# Patient Record
Sex: Female | Born: 1980 | Race: White | Hispanic: No | Marital: Married | Smoking: Current every day smoker
Health system: Southern US, Community
[De-identification: ages and names within clinical notes are randomized; demographics above are authoritative.]

## PROBLEM LIST (undated history)

## (undated) DIAGNOSIS — R319 Hematuria, unspecified: Secondary | ICD-10-CM

## (undated) DIAGNOSIS — Z720 Tobacco use: Secondary | ICD-10-CM

## (undated) DIAGNOSIS — N946 Dysmenorrhea, unspecified: Secondary | ICD-10-CM

## (undated) DIAGNOSIS — F419 Anxiety disorder, unspecified: Secondary | ICD-10-CM

## (undated) DIAGNOSIS — N920 Excessive and frequent menstruation with regular cycle: Secondary | ICD-10-CM

## (undated) DIAGNOSIS — N809 Endometriosis, unspecified: Secondary | ICD-10-CM

## (undated) DIAGNOSIS — R5383 Other fatigue: Secondary | ICD-10-CM

## (undated) DIAGNOSIS — N760 Acute vaginitis: Secondary | ICD-10-CM

## (undated) DIAGNOSIS — R87619 Unspecified abnormal cytological findings in specimens from cervix uteri: Secondary | ICD-10-CM

## (undated) DIAGNOSIS — D649 Anemia, unspecified: Secondary | ICD-10-CM

## (undated) HISTORY — DX: Anemia, unspecified: D64.9

## (undated) HISTORY — DX: Anxiety disorder, unspecified: F41.9

## (undated) HISTORY — DX: Hematuria, unspecified: R31.9

## (undated) HISTORY — DX: Other fatigue: R53.83

## (undated) HISTORY — DX: Dysmenorrhea, unspecified: N94.6

## (undated) HISTORY — DX: Acute vaginitis: N76.0

## (undated) HISTORY — DX: Excessive and frequent menstruation with regular cycle: N92.0

## (undated) HISTORY — DX: Endometriosis, unspecified: N80.9

## (undated) HISTORY — DX: Unspecified abnormal cytological findings in specimens from cervix uteri: R87.619

## (undated) HISTORY — DX: Tobacco use: Z72.0

## (undated) HISTORY — PX: CERVICAL BIOPSY  W/ LOOP ELECTRODE EXCISION: SUR135

---

## 2004-09-25 ENCOUNTER — Emergency Department: Payer: Self-pay | Admitting: Emergency Medicine

## 2005-11-25 ENCOUNTER — Emergency Department: Payer: Self-pay | Admitting: Unknown Physician Specialty

## 2007-06-03 ENCOUNTER — Inpatient Hospital Stay: Payer: Self-pay | Admitting: Unknown Physician Specialty

## 2007-06-03 ENCOUNTER — Observation Stay: Payer: Self-pay

## 2008-06-20 ENCOUNTER — Emergency Department: Payer: Self-pay | Admitting: Emergency Medicine

## 2009-04-30 ENCOUNTER — Emergency Department: Payer: Self-pay | Admitting: Emergency Medicine

## 2009-05-13 HISTORY — PX: TUBAL LIGATION: SHX77

## 2009-06-22 ENCOUNTER — Emergency Department: Payer: Self-pay | Admitting: Unknown Physician Specialty

## 2011-09-26 ENCOUNTER — Emergency Department: Payer: Self-pay | Admitting: *Deleted

## 2011-11-19 ENCOUNTER — Emergency Department: Payer: Self-pay | Admitting: Emergency Medicine

## 2013-05-13 HISTORY — PX: LAPAROSCOPIC ABDOMINAL EXPLORATION: SHX6249

## 2013-05-13 HISTORY — PX: LAPAROSCOPY: SHX197

## 2013-05-13 HISTORY — PX: VAGINAL HYSTERECTOMY: SUR661

## 2013-06-07 ENCOUNTER — Emergency Department: Payer: Self-pay | Admitting: Emergency Medicine

## 2013-06-07 LAB — CBC WITH DIFFERENTIAL/PLATELET
Basophil #: 0.1 10*3/uL (ref 0.0–0.1)
Basophil %: 0.8 %
Eosinophil #: 0.1 10*3/uL (ref 0.0–0.7)
Eosinophil %: 1.2 %
HCT: 44.5 % (ref 35.0–47.0)
HGB: 14.5 g/dL (ref 12.0–16.0)
Lymphocyte #: 3.1 10*3/uL (ref 1.0–3.6)
Lymphocyte %: 26.8 %
MCH: 28.9 pg (ref 26.0–34.0)
MCHC: 32.7 g/dL (ref 32.0–36.0)
MCV: 88 fL (ref 80–100)
MONOS PCT: 3.7 %
Monocyte #: 0.4 x10 3/mm (ref 0.2–0.9)
NEUTROS ABS: 7.8 10*3/uL — AB (ref 1.4–6.5)
NEUTROS PCT: 67.5 %
PLATELETS: 237 10*3/uL (ref 150–440)
RBC: 5.03 10*6/uL (ref 3.80–5.20)
RDW: 13.5 % (ref 11.5–14.5)
WBC: 11.6 10*3/uL — ABNORMAL HIGH (ref 3.6–11.0)

## 2013-06-07 LAB — COMPREHENSIVE METABOLIC PANEL
ALBUMIN: 4.4 g/dL (ref 3.4–5.0)
ALK PHOS: 94 U/L
AST: 20 U/L (ref 15–37)
Anion Gap: 3 — ABNORMAL LOW (ref 7–16)
BUN: 9 mg/dL (ref 7–18)
Bilirubin,Total: 0.3 mg/dL (ref 0.2–1.0)
CALCIUM: 9.4 mg/dL (ref 8.5–10.1)
CO2: 30 mmol/L (ref 21–32)
Chloride: 102 mmol/L (ref 98–107)
Creatinine: 0.63 mg/dL (ref 0.60–1.30)
EGFR (African American): >60
EGFR (Non-African Amer.): >60
Glucose: 88 mg/dL (ref 65–99)
Osmolality: 268 (ref 275–301)
POTASSIUM: 3.3 mmol/L — AB (ref 3.5–5.1)
SGPT (ALT): 18 U/L (ref 12–78)
SODIUM: 135 mmol/L — AB (ref 136–145)
Total Protein: 8.5 g/dL — ABNORMAL HIGH (ref 6.4–8.2)

## 2013-06-07 LAB — URINALYSIS, COMPLETE
BILIRUBIN, UR: NEGATIVE
GLUCOSE, UR: NEGATIVE mg/dL (ref 0–75)
Hyaline Cast: 1
Ketone: NEGATIVE
Leukocyte Esterase: NEGATIVE
NITRITE: NEGATIVE
PROTEIN: NEGATIVE
Ph: 6 (ref 4.5–8.0)
RBC,UR: 5 /HPF (ref 0–5)
SPECIFIC GRAVITY: 1.019 (ref 1.003–1.030)
Squamous Epithelial: 11
WBC UR: 2 /HPF (ref 0–5)

## 2013-06-07 LAB — LIPASE, BLOOD: Lipase: 75 U/L (ref 73–393)

## 2013-06-08 LAB — GC/CHLAMYDIA PROBE AMP

## 2013-06-08 LAB — WET PREP, GENITAL

## 2013-10-20 ENCOUNTER — Ambulatory Visit: Payer: Self-pay | Admitting: Obstetrics and Gynecology

## 2013-10-20 LAB — CBC
HCT: 41.4 % (ref 35.0–47.0)
HGB: 13.6 g/dL (ref 12.0–16.0)
MCH: 29.5 pg (ref 26.0–34.0)
MCHC: 32.7 g/dL (ref 32.0–36.0)
MCV: 90 fL (ref 80–100)
PLATELETS: 207 10*3/uL (ref 150–440)
RBC: 4.59 10*6/uL (ref 3.80–5.20)
RDW: 13.6 % (ref 11.5–14.5)
WBC: 10.7 10*3/uL (ref 3.6–11.0)

## 2013-10-25 ENCOUNTER — Ambulatory Visit: Payer: Self-pay | Admitting: Obstetrics and Gynecology

## 2013-10-26 LAB — PATHOLOGY REPORT

## 2014-03-03 ENCOUNTER — Ambulatory Visit: Payer: Self-pay | Admitting: Obstetrics and Gynecology

## 2014-03-03 LAB — CBC
HCT: 41.2 % (ref 35.0–47.0)
HGB: 13.8 g/dL (ref 12.0–16.0)
MCH: 29.8 pg (ref 26.0–34.0)
MCHC: 33.4 g/dL (ref 32.0–36.0)
MCV: 89 fL (ref 80–100)
Platelet: 190 10*3/uL (ref 150–440)
RBC: 4.62 10*6/uL (ref 3.80–5.20)
RDW: 13.5 % (ref 11.5–14.5)
WBC: 9.3 10*3/uL (ref 3.6–11.0)

## 2014-03-03 LAB — BASIC METABOLIC PANEL
ANION GAP: 3 — AB (ref 7–16)
BUN: 12 mg/dL (ref 7–18)
CHLORIDE: 111 mmol/L — AB (ref 98–107)
CO2: 27 mmol/L (ref 21–32)
Calcium, Total: 8.5 mg/dL (ref 8.5–10.1)
Creatinine: 0.74 mg/dL (ref 0.60–1.30)
EGFR (African American): >60
EGFR (Non-African Amer.): >60
Glucose: 93 mg/dL (ref 65–99)
Osmolality: 281 (ref 275–301)
Potassium: 3.8 mmol/L (ref 3.5–5.1)
Sodium: 141 mmol/L (ref 136–145)

## 2014-03-07 ENCOUNTER — Ambulatory Visit: Payer: Self-pay | Admitting: Obstetrics and Gynecology

## 2014-03-08 LAB — HEMOGLOBIN
HGB: 7 g/dL — ABNORMAL LOW (ref 12.0–16.0)
HGB: 7.2 g/dL — AB (ref 12.0–16.0)

## 2014-03-21 ENCOUNTER — Ambulatory Visit: Payer: Self-pay | Admitting: Obstetrics and Gynecology

## 2014-03-21 LAB — COMPREHENSIVE METABOLIC PANEL
ALBUMIN: 2.9 g/dL — AB (ref 3.4–5.0)
ALT: 25 U/L
AST: 11 U/L — AB (ref 15–37)
Alkaline Phosphatase: 83 U/L
Anion Gap: 6 — ABNORMAL LOW (ref 7–16)
BILIRUBIN TOTAL: 0.5 mg/dL (ref 0.2–1.0)
BUN: 8 mg/dL (ref 7–18)
CREATININE: 0.7 mg/dL (ref 0.60–1.30)
Calcium, Total: 8.5 mg/dL (ref 8.5–10.1)
Chloride: 105 mmol/L (ref 98–107)
Co2: 28 mmol/L (ref 21–32)
EGFR (Non-African Amer.): >60
Glucose: 145 mg/dL — ABNORMAL HIGH (ref 65–99)
OSMOLALITY: 278 (ref 275–301)
POTASSIUM: 3.3 mmol/L — AB (ref 3.5–5.1)
Sodium: 139 mmol/L (ref 136–145)
Total Protein: 7.6 g/dL (ref 6.4–8.2)

## 2014-03-21 LAB — LIPASE, BLOOD: Lipase: 51 U/L — ABNORMAL LOW (ref 73–393)

## 2014-03-21 LAB — CBC
HCT: 26.2 % — ABNORMAL LOW (ref 35.0–47.0)
HGB: 8.5 g/dL — AB (ref 12.0–16.0)
MCH: 28.6 pg (ref 26.0–34.0)
MCHC: 32.5 g/dL (ref 32.0–36.0)
MCV: 88 fL (ref 80–100)
PLATELETS: 359 10*3/uL (ref 150–440)
RBC: 2.98 10*6/uL — ABNORMAL LOW (ref 3.80–5.20)
RDW: 14 % (ref 11.5–14.5)
WBC: 13.2 10*3/uL — AB (ref 3.6–11.0)

## 2014-03-21 LAB — PROTIME-INR
INR: 1.2
PROTHROMBIN TIME: 14.7 s (ref 11.5–14.7)

## 2014-03-21 LAB — APTT: ACTIVATED PTT: 37.9 s — AB (ref 23.6–35.9)

## 2014-03-21 LAB — HEMATOCRIT: HCT: 25.6 % — ABNORMAL LOW (ref 35.0–47.0)

## 2014-03-22 LAB — CBC WITH DIFFERENTIAL/PLATELET
BASOS ABS: 0.1 10*3/uL (ref 0.0–0.1)
Basophil %: 0.4 %
Eosinophil #: 0 10*3/uL (ref 0.0–0.7)
Eosinophil %: 0 %
HCT: 24 % — ABNORMAL LOW (ref 35.0–47.0)
HGB: 7.7 g/dL — ABNORMAL LOW (ref 12.0–16.0)
Lymphocyte #: 0.7 10*3/uL — ABNORMAL LOW (ref 1.0–3.6)
Lymphocyte %: 5.5 %
MCH: 28.4 pg (ref 26.0–34.0)
MCHC: 32.1 g/dL (ref 32.0–36.0)
MCV: 88 fL (ref 80–100)
MONO ABS: 0.6 x10 3/mm (ref 0.2–0.9)
MONOS PCT: 4.8 %
Neutrophil #: 10.7 10*3/uL — ABNORMAL HIGH (ref 1.4–6.5)
Neutrophil %: 89.3 %
Platelet: 274 10*3/uL (ref 150–440)
RBC: 2.71 10*6/uL — ABNORMAL LOW (ref 3.80–5.20)
RDW: 14.2 % (ref 11.5–14.5)
WBC: 12 10*3/uL — ABNORMAL HIGH (ref 3.6–11.0)

## 2014-06-13 IMAGING — US US PELV - US TRANSVAGINAL
1 series · 14 of 25 positions shown · non-contrast
Comparison: None

CLINICAL DATA: Diffuse lower abdominal pain

EXAM:
TRANSABDOMINAL AND TRANSVAGINAL ULTRASOUND OF PELVIS
TECHNIQUE: Both transabdominal and transvaginal ultrasound examinations of the
pelvis were performed. Transabdominal technique was performed for
global imaging of the pelvis including uterus, ovaries, adnexal
regions, and pelvic cul-de-sac. It was necessary to proceed with
endovaginal exam following the transabdominal exam to visualize the
endometrium and adnexa.

[Series 1: us pelv - us transvaginal · 0.22mm/px · 14 of 38 slices shown]
[im 1/38]
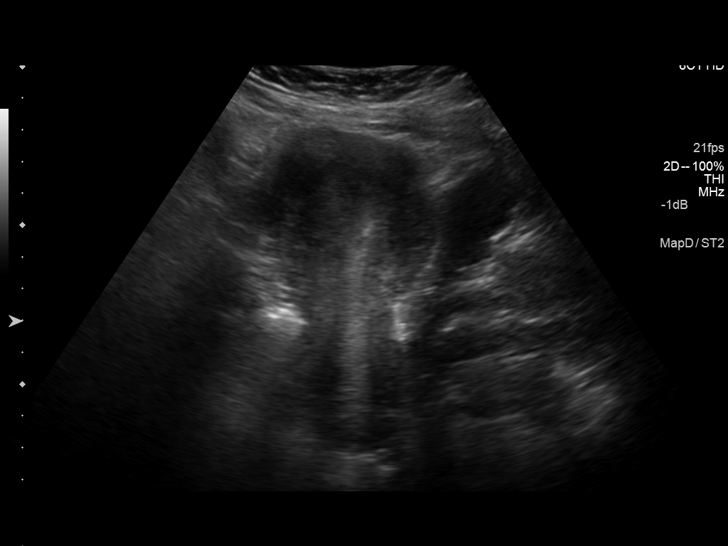
[im 4/38]
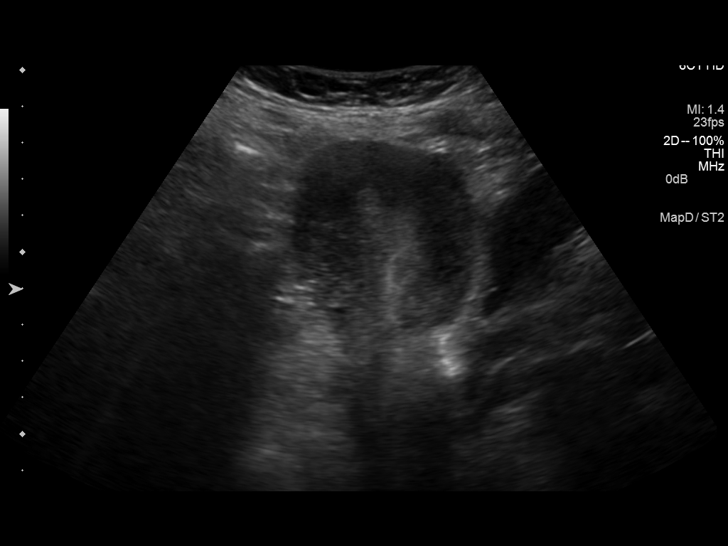
[im 7/38]
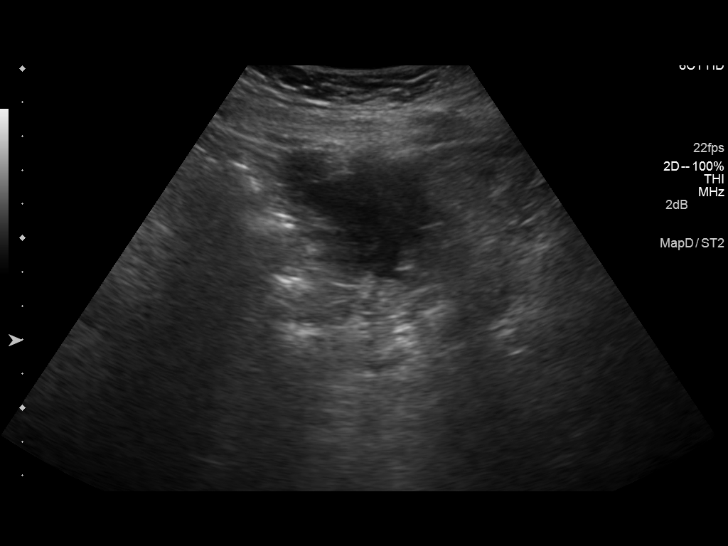
[im 10/38]
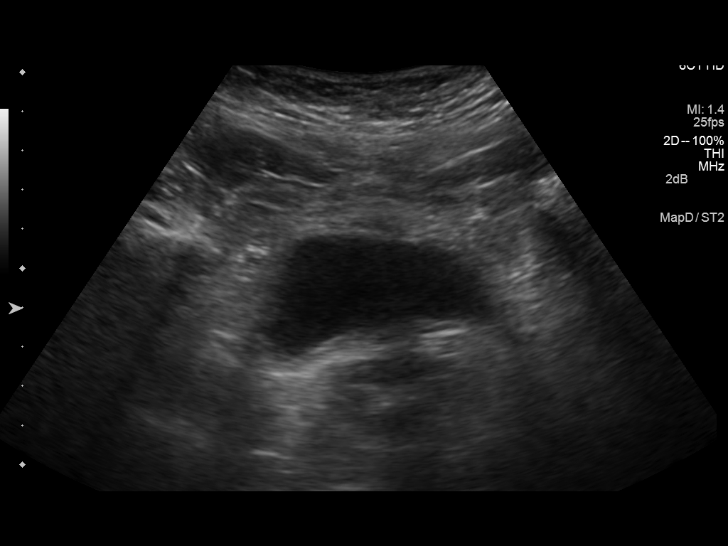
[im 13/38]
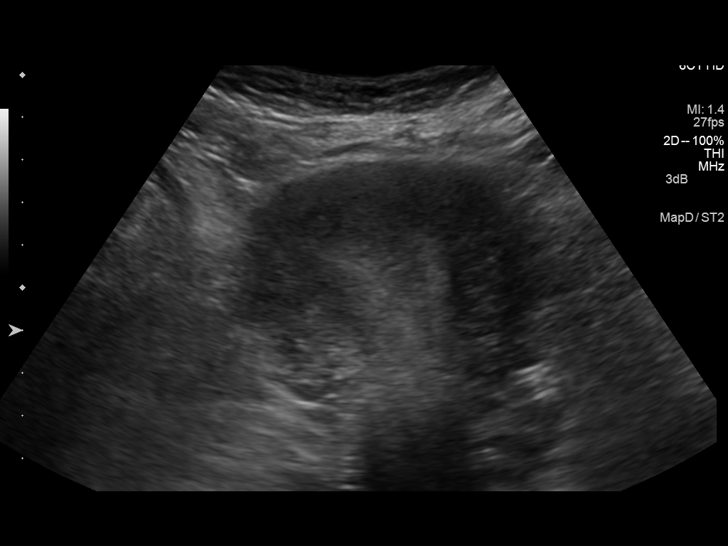
[im 14/38]
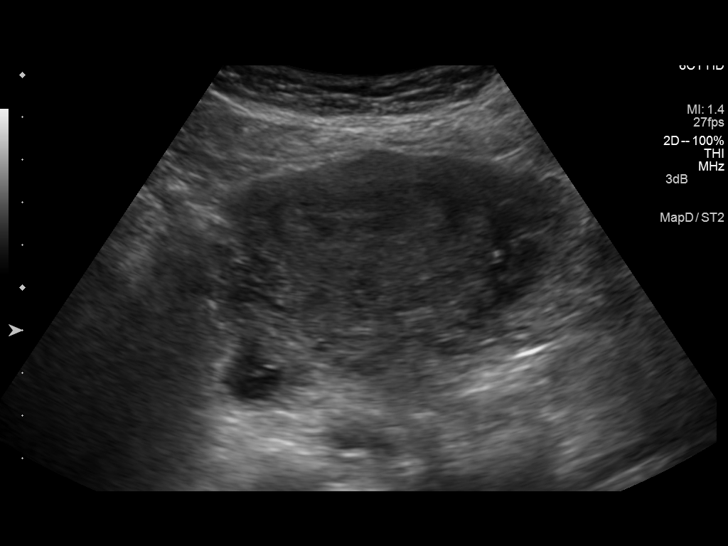
[im 17/38]
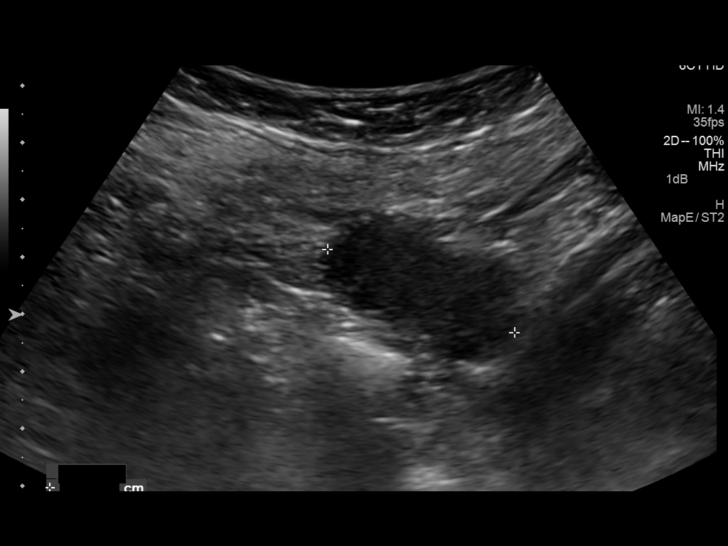
[im 21/38]
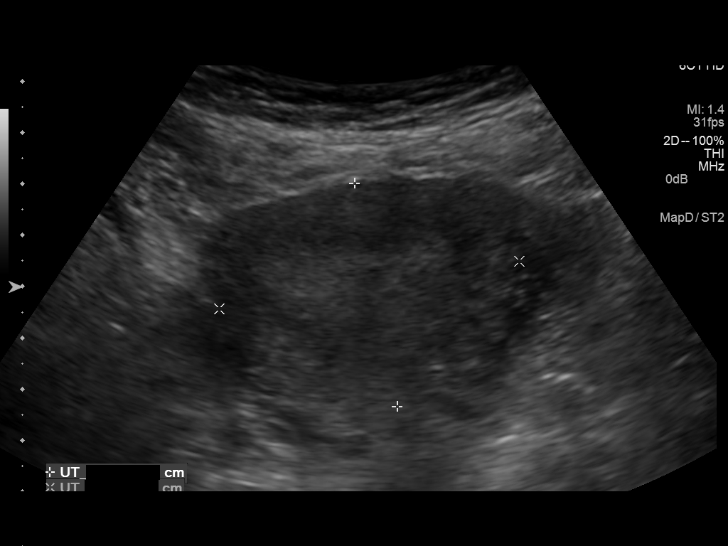
[im 24/38]
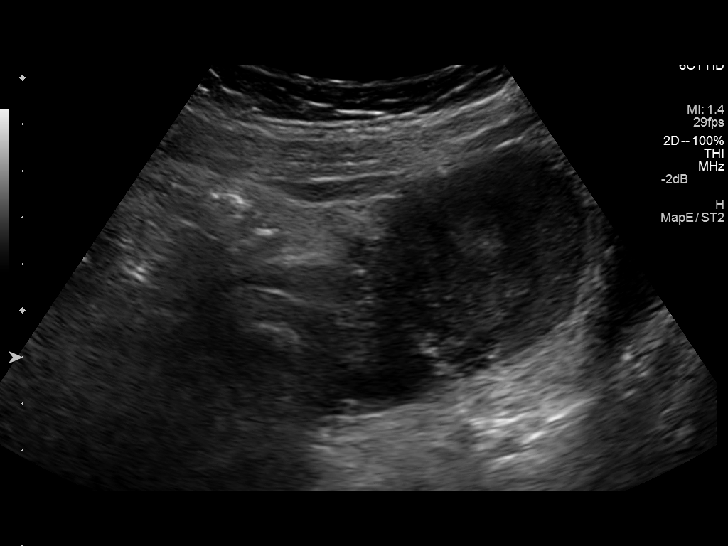
[im 25/38]
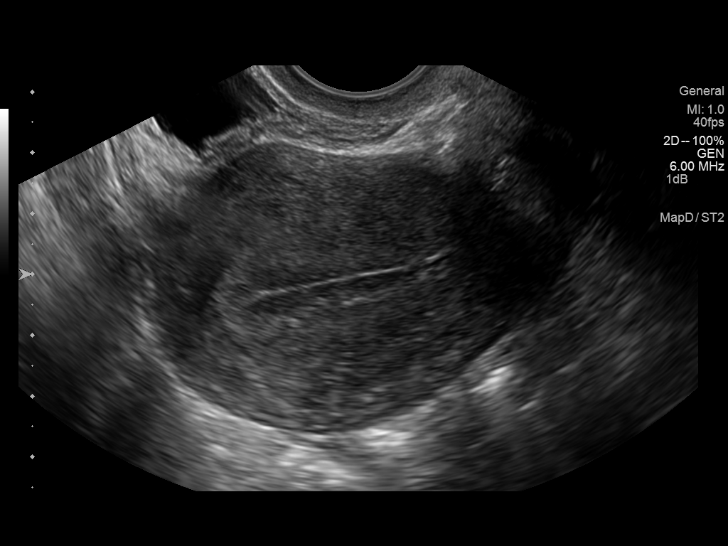
[im 28/38]
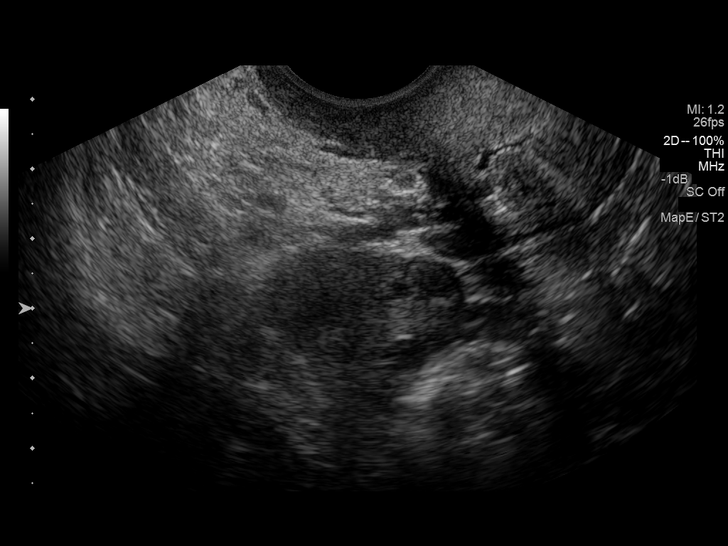
[im 31/38]
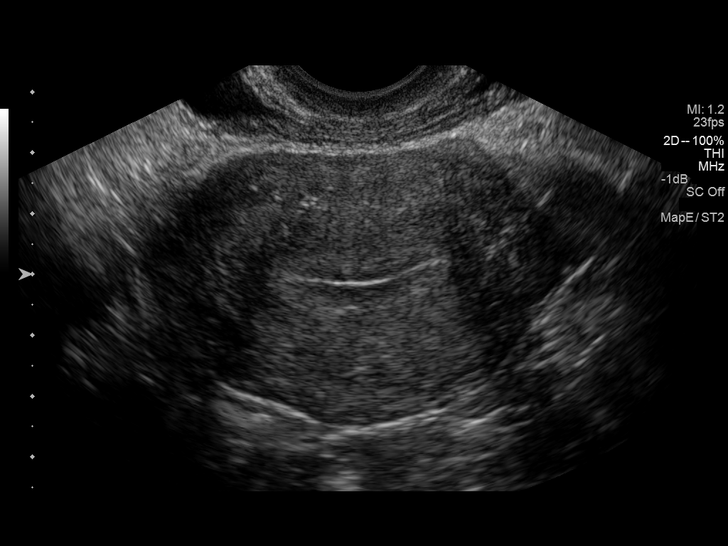
[im 34/38]
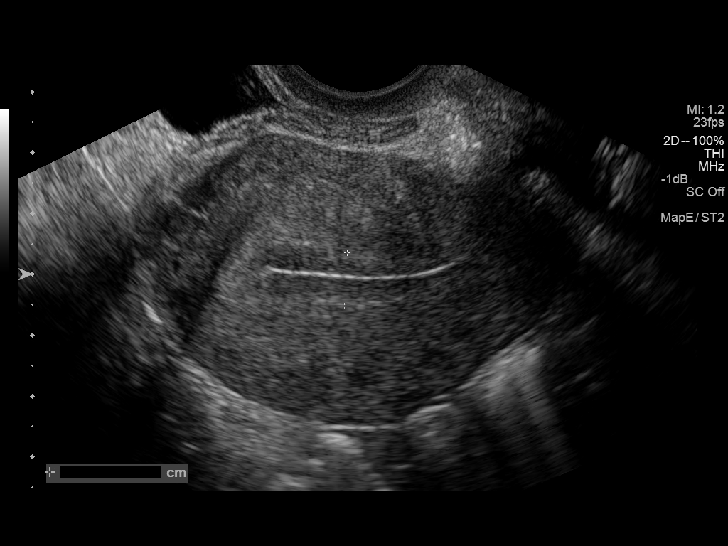
[im 38/38]
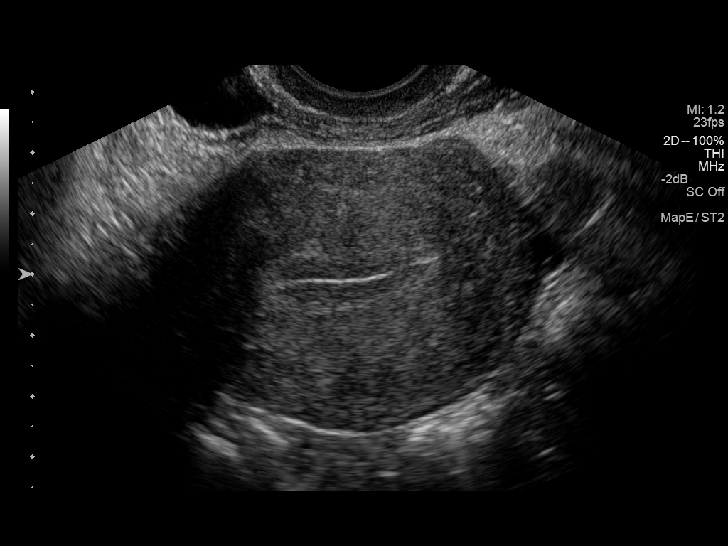

[14 of 25 positions shown; findings below may reference images not displayed]

FINDINGS: Uterus

Measurements: 7.5 x 4.4 x 5.9 cm. No fibroids or other mass
visualized.

Endometrium

Thickness: 9 mm.  No focal abnormality visualized.

Right ovary

Measurements: 2.8 x 2.2 x 2.1 cm. Normal appearance/no adnexal mass.

Left ovary

Measurements: 3.6 x 1.9 x 2.3 cm. Normal appearance/no adnexal mass.

Other findings

No free fluid.
IMPRESSION: Pelvic ultrasound within normal limits.

## 2014-09-03 NOTE — Op Note (Signed)
PATIENT NAME:  Laurie Huynh, [name] MR#:  [id number] DATE OF BIRTH:  Mar 06, 1981  DATE OF PROCEDURE:  10/25/2013  PREOPERATIVE DIAGNOSES:  1. Severe dysmenorrhea.  2. Chronic pelvic pain.  3. Menorrhagia.   POSTOPERATIVE DIAGNOSES:  1. Severe dysmenorrhea.  2. Chronic pelvic pain.  3. Menorrhagia.  4. Pelvic adhesions.   OPERATIVE PROCEDURE: Laparoscopy with peritoneal biopsies.   SURGEON: Prentice DockerMartin A. Latrisha Coiro, MD    FIRST ASSISTANT: Mancel BaleJustin Allis, PA-S   ANESTHESIA: General endotracheal.   INDICATIONS: The patient is a 34 year old white female, para 3, 0-0-3, status post BTL for contraception, with history of previous cesarean section x 1, presents for evaluation of chronic pelvic pain in the form of severe dysmenorrhea and deep-thrusting dyspareunia. She also had associated menorrhagia. The patient reports discomfort complicating her activities of daily living.   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 The uterus is potentially operable through both LAVH or TVH technique if indicated.   Findings at laparoscopy included evidence of previous BTL. The ovaries were grossly normal. There was a small white lesion noted on the uterine serosa in the left fundus region. There was a cul-de-sac pseudofenestration on the right side and there were grossly normal-appearing ovaries, appendix, gallbladder, liver. There were adhesions in the lower uterine segment in the region of the bladder flap on the left which were also biopsied.   DESCRIPTION OF PROCEDURE: Patient was brought to the operating room where she was placed in the supine position. General endotracheal anesthesia was induced without difficulty. She was placed in dorsal lithotomy position using the bumblebee stirrups. A ChloraPrep and Betadine abdominal, perineal, intravaginal prep and drape was performed in standard fashion. A Red Robinson catheter was used to drain 100 mL of urine from the bladder.  A Hulka  Tenaculum was placed onto the cervix to facilitate uterine manipulation. Subumbilical vertical incision 5 mm in length was made. The Optiview laparoscopic trocar system was used to place a 5 mm port directly into the abdominopelvic cavity without evidence of bowel or vascular injury. Next, a 2nd 5 mm port was placed in the lower abdomen just above the symphysis pubis to the right of midline. No injury was identified. The above-noted findings were photo documented. Representative biopsies were taken of the central cul-de-sac, right side of the cul-de-sac, anterior fundal uterine serosa on the left, and bladder flap adhesions. Cautery was used with Kleppinger bipolar forceps to control bleeding from the uterine biopsy sites. After verifying there was adequate hemostasis, the procedure was terminated with all instrumentation being removed from the abdominal cavity. The incisions were closed with Dermabond. The patient was then awakened, extubated, mobilized, and taken to the recovery room in satisfactory condition.   ESTIMATED BLOOD LOSS: Minimal.   COMPLICATIONS: None.   All instruments, needle, and sponge counts were verified as correct. IV fluids infused were 1 liter. Urine output was 100 mL.   ____________________________ Prentice DockerMartin A. Dejae Bernet, MD mad:dd D: 10/25/2013 14:11:06 ET T: 10/25/2013 19:26:41 ET JOB#: [id number]  cc: Daphine DeutscherMartin A. Erdem Naas, MD, <Dictator> [facility] Prentice DockerMARTIN A Danikah Budzik MD ELECTRONICALLY SIGNED 11/10/2013 17:23

## 2014-09-03 NOTE — Op Note (Signed)
PATIENT NAME:  Laurie, Huynh MR#:  [id number] DATE OF BIRTH:  [birth year]  DATE OF PROCEDURE:  03/21/2014  PREOPERATIVE DIAGNOSES:  1.  Pelvic pain/ back pain.  2.  Suspected pelvic hematoma.  3.  Status post laparoscopic-assisted vaginal hysterectomy, bilateral salpingectomy 2 weeks ago.  POSTOPERATIVE DIAGNOSES: 1.  Pelvic pain/ back pain.  2.  Suspected pelvic hematoma.  3.  Status post laparoscopic-assisted vaginal hysterectomy, bilateral salpingectomy 2 weeks ago.  OPERATIVE PROCEDURE: Laparoscopic evacuation of pelvic hematoma.   SURGEON: Prentice Docker. Janey Petron, MD   FIRST ASSISTANT: None.   ANESTHESIA: General endotracheal.   INDICATIONS: The patient is a 34 year old white female, status post LAVH/bilateral salpingectomy 2 weeks ago, who presented through the Emergency Room with new onset of lower abdominal pelvic pain and vaginal bleeding. Workup in the ER demonstrated a drop in hemoglobin to 8.6 grams and pelvic ultrasound demonstrated a possible hematoma within the pelvis. The patient was admitted for surgical evaluation and management.   FINDINGS AT SURGERY: Revealed an organized hematoma within the pelvis with approximately 250 mL of old blood removed. Adhesiolysis and extensive irrigation of the pelvis was performed with evidence of a mild diffuse peritoneal ooze without obvious source of active arterial bleeding. Arista was sprinkled over the diffuse peritoneum in the vaginal cuff region.   DESCRIPTION OF PROCEDURE: The patient was brought to the operating room where she was placed in the supine position. General endotracheal anesthesia was induced without difficulty. She was placed in the dorsal lithotomy position using bumblebee stirrups. A ChloraPrep and Betadine abdominal, perineal, intravaginal prep and drape was performed in standard fashion. A red Robinson catheter was used to drain 100 mL of urine from the bladder. A subumbilical 5 mm incision was made and the  Optiview laparoscopic trocar system was placed intra-abdominally under direct visualization. No bowel or vascular injury was encountered. Pneumoperitoneum was created. A second port was placed lateral to the initial surgical port in the right lower quadrant. It was placed far laterally because of the adhesions between the omentum and the pelvic cuff and anterior abdominal wall. A second port was also placed in the left lower quadrant in the region of the previously placed port from the last surgery. The anterior abdominal wall omental adhesions were taken down bluntly. Additional adhesions were taken up from the vaginal cuff as well as in the cul-de-sac. Eventually, both ovaries were identified bilaterally and the infundibulopelvic ligaments were intact. No gross abnormalities of the ovaries were noted. There was no bleeding from the bilateral salpingectomy sites. The vaginal cuff and anterior peritoneal surface overlying the bladder was somewhat cloudy in appearance and had a minimal diffuse ooze present. Copious irrigation was performed of the pelvis with the irrigant fluid being aspirated. There was no need for any active cautery for hemostasis as no arterial bleeding was identified. Once satisfied with overall inspection of the pelvis and upper abdomen, Arista was applied, 3 grams, in standard fashion over the pelvic cuff. The procedure was then terminated with all instrumentation being removed from the abdominopelvic cavity. Pneumoperitoneum was released. The incisions were closed with 4-0 Vicryl sutures. Dermabond glue was placed over the incisions. The patient was then awakened, mobilized, and taken to the recovery room in satisfactory condition.   ESTIMATED BLOOD LOSS: Minimal. There was approximately 250 mL of old blood evacuated from the organized hematoma.  INTRAVENOUS FLUIDS: Crystalloid 1200 mL.   URINE OUTPUT: 100 mL.  ANTIBIOTICS RECEIVED: The patient did receive gentamicin and clindamycin  antibiotic prophylaxis  because of a temperature of 101 noted in the operating room. A second dose will be given postoperatively.    ____________________________ Prentice DockerMartin A. Lucion Dilger, MD mad:TT D: 03/21/2014 16:37:59 ET T: 03/21/2014 17:48:13 ET JOB#: [id number]  cc: Daphine DeutscherMartin A. Malillany Kazlauskas, MD, <Dictator> [facility] Prentice DockerMARTIN A Ricardo Schubach MD ELECTRONICALLY SIGNED 03/22/2014 13:49

## 2014-09-03 NOTE — H&P (Signed)
PATIENT NAME:  Laurie Huynh, Shenetta L MR#:  [id number] DATE OF BIRTH:  11-09-1980  DATE OF ADMISSION:  03/21/2014  DIAGNOSES: 1. Abdominal pain.  2. Status post laparoscopic-assisted vaginal hysterectomy, bilateral salpingo-oophorectomy 2 weeks ago.  3. Suspected intra-abdominal bleed.   HISTORY: The patient is a 34 year old, married, white female, multiparous, status post LAVH, BSO 2 weeks ago, who presents for admission and surgical evaluation of suspected postoperative bleeding with probable hematoma identified on pelvic ultrasound. The patient had a postoperative hemoglobin of 7.1. One week later, her hemoglobin was 9.1. Within the past 24 hours, she developed lower abdominal pain and back pain along with onset of bright red vaginal bleeding. Because of these symptoms, she was brought to the Emergency Room.   Findings in the ER were notable for a vaginal bleeding source, hemoglobin of 8.5 (decreased), and a pelvic ultrasound that demonstrated a 12 cm x 7 cm heterogenous mass, likely consistent with clot in the pelvis. Because of these findings and the patient's symptomatology, she is now admitted for laparoscopy for further evaluation, evacuation of clot, and identification of possible source of intra-abdominal bleed.   PAST MEDICAL HISTORY: Endometriosis.  PAST SURGICAL HISTORY: C-section, LAVH, BSO, laparoscopy with peritoneal biopsies.   PAST OBSTETRIC HISTORY: Multiparous,   FAMILY HISTORY: Noncontributory.   SOCIAL HISTORY: The patient is a tobacco user. She does not drink alcohol. Does not use drugs.   CURRENT MEDICATIONS: Zoloft 50 mg daily, ibuprofen 800 mg t.i.d., folic acid daily, ferrous sulfate b.i.d.   DRUG ALLERGIES: PENICILLIN.   REVIEW OF SYSTEMS: As per HPI.   PHYSICAL EXAMINATION:  VITAL SIGNS: Heart rate 100, blood pressure 100/60. GENERAL: The patient is a pleasant, well-appearing, alert, and oriented female in no acute distress.  ABDOMEN: Soft, slightly tender  without peritoneal signs or guarding.  LUNGS: Clear.  HEART: Mild tachycardia. No murmur.  ABDOMEN: Soft, nondistended, 1 to 2/4 tenderness in lower quadrants bilaterally.  PELVIC: External genitalia normal and BUS normal. Vagina has good vault support. The suture line from previous surgery is intact. There is minimal oozing from the vaginal cuff demonstrating some burgundy blood. Bimanual exam reveals tenderness at the vaginal cuff site with a sense of fullness without discrete mass. No adnexal masses can be appreciated. Rectovaginal exam confirms the central pelvic mass.  EXTREMITIES: Warm and dry.   IMPRESSION:  1. Two weeks status post laparoscopic-assisted vaginal hysterectomy, bilateral salpingo-oophorectomy.  2. Suspected intra-abdominal bleed with pelvic hematoma, symptomatic.   PLAN: Laparoscopy with evacuation of clot and identification of possible bleeding source, possible laparotomy.   CONSENT: The patient is to undergo laparoscopy with evacuation of hematoma and identification of bleeding source. She is understanding of the planned procedure and is aware of and is accepting of all surgical risks which include but are not limited to bleeding, infection, pelvic organ injury with need for repair, blood clot disorders, anesthesia risks, etc. She does understanding that, if the bleeding site is not identified adequately or if bleeding cannot be controlled laparoscopically, then laparotomy may be necessary. All questions are answered. Informed consent is given. The patient is ready and willing to proceed with surgery as scheduled. ____________________________ Prentice DockerMartin A. Rabecka Brendel, MD mad:jh D: 03/21/2014 12:53:00 ET T: 03/21/2014 14:06:21 ET  JOB#: [id number] Daphine DeutscherMARTIN A Mikaelah Trostle MD ELECTRONICALLY SIGNED 03/22/2014 13:49

## 2014-09-03 NOTE — Op Note (Signed)
PATIENT NAME:  Laurie Huynh, Delight L MR#:  [id number] DATE OF BIRTH:  Jun 26, 1980  DATE OF PROCEDURE:  03/07/2014  PREOPERATIVE DIAGNOSES:  1.  Symptomatic endometriosis.  2.  Pelvic adhesive disease.  POSTOPERATIVE DIAGNOSES: 1.  Symptomatic endometriosis.  2.  Pelvic adhesive disease.    OPERATIVE PROCEDURE: Laparoscopic assisted vaginal hysterectomy and bilateral salpingectomy.   SURGEON: Prentice DockerMartin A. Johnryan Sao, MD  FIRST ASSISTANT: Anika S. Valentino Saxonherry, MD  ANESTHESIA: General endotracheal.   INDICATIONS: The patient is a 34 year old white female, para 3-0-0-3, status post BTL for contraception, with history of previous cesarean section x1, status post laparoscopy in June 2015 with findings suspicious for endometriosis and pelvic adhesions, who presents for definitive surgery.   FINDINGS AT SURGERY: Revealed extensive bladder flap scarring in the lower uterine segment. The ovaries and tubes appeared grossly normal.   DESCRIPTION OF PROCEDURE: The patient was brought to the Operating Room, where she was placed in the supine position. General endotracheal anesthesia was induced without difficulty. She was placed in the dorsal lithotomy position using the bumblebee stirrups. A ChloraPrep and Betadine abdominal, perineal, and intravaginal prep and drape was performed in standard fashion. Foley catheter was placed and was draining clear yellow urine. A Hulka tenaculum was placed onto the cervix to facilitate uterine manipulation. Supraumbilical vertical incision 5 mm in length was made in the subumbilical region. Two other 5 mm ports were placed in the right and left lower quadrants, respectively, under direct visualization. The above-noted findings were photo documented.   Bilateral salpingectomy was performed using the Ace Harmonic scalpel and graspers. The fallopian tubes were extracted from the 5 mm ports, once they were removed. Next, the round ligaments were clamped, coagulated and cut using the  Harmonic scalpel. The utero-ovarian ligaments bilaterally were clamped, coagulated and cut using the Ace Harmonic scalpel. The cardinal/broad ligament complexes were dissected using the Harmonic scalpel down to the level of the uterosacral ligaments. The bladder flap was created using the Harmonic scalpel. The uterine arteries were skeletonized, and then cauterized using the Kleppinger bipolar forceps. This was followed by cutting through them with the Ace Harmonic scalpel.   Following completion of the abdominal portion of the surgical procedure, the attention was then turned to the vaginal portion of the procedure for completion. The Hulka tenaculum was taken off the cervix, and a double-tooth tenaculum was placed onto the cervix. A weighted speculum was placed into the vagina. The posterior colpotomy was made with Mayo scissors. Uterosacral ligaments were clamped, cut, and stick tied using 0 Vicryl suture. The remainder of the cardinal broad ligament complexes were then taken down using the clamping, cutting, and stick tying technique. The cervix was circumscribed to enable dissection of the vagina and the bladder off the lower uterine segment using the Ace Harmonic scalpel. Eventually, the anterior cul-de-sac was entered. The remainder of the pedicle attachments were taken down with the clamping, cutting, and stick tying technique and the uterus was then removed from the operative field. Good hemostasis was noted. The posterior cuff was run using a 0 Vicryl suture in a running baseball locking stitch manner. This was followed by closure of the vagina using a simple interrupted technique of 2-0 chromic.   Finally, repeat laparoscopy was performed to verify that good hemostasis was obtained. There was minimal oozing from the vaginal cuff closure, and this was controlled with Kleppinger bipolar forceps cautery.   Following irrigation and reassessment, good hemostasis was noted. All irrigant fluid was  aspirated. The procedure was then  terminated with all instrumentation being removed from the abdominopelvic cavity. Pneumoperitoneum was released. The incisions were closed with Dermabond glue. The patient was then awakened, mobilized, and taken to the recovery room in satisfactory condition.   ESTIMATED BLOOD LOSS: 200 mL   INTRAVENOUS FLUIDS: 800 mL   URINE OUTPUT: Clear, but not quantified at the time of this dictation.    ____________________________ Prentice Docker Jamario Colina, MD mad:MT D: 03/07/2014 10:03:02 ET T: 03/07/2014 16:39:13 ET JOB#: [id number]  cc: Daphine Deutscher A. Rodolphe Edmonston, MD, <Dictator> [facility] Prentice Docker Leiliana Foody MD ELECTRONICALLY SIGNED 03/14/2014 13:09

## 2014-09-05 LAB — SURGICAL PATHOLOGY

## 2014-11-30 ENCOUNTER — Ambulatory Visit (INDEPENDENT_AMBULATORY_CARE_PROVIDER_SITE_OTHER): Payer: Medicaid Other | Admitting: Obstetrics and Gynecology

## 2014-11-30 ENCOUNTER — Encounter: Payer: Self-pay | Admitting: Obstetrics and Gynecology

## 2014-11-30 VITALS — BP 94/67 | HR 91 | Ht 65.0 in | Wt 162.0 lb

## 2014-11-30 DIAGNOSIS — B9689 Other specified bacterial agents as the cause of diseases classified elsewhere: Secondary | ICD-10-CM

## 2014-11-30 DIAGNOSIS — N760 Acute vaginitis: Secondary | ICD-10-CM | POA: Diagnosis not present

## 2014-11-30 DIAGNOSIS — A499 Bacterial infection, unspecified: Secondary | ICD-10-CM | POA: Diagnosis not present

## 2014-11-30 DIAGNOSIS — R5383 Other fatigue: Secondary | ICD-10-CM | POA: Insufficient documentation

## 2014-11-30 MED ORDER — METRONIDAZOLE 0.75 % VA GEL: 1.0000 | Freq: Every day | VAGINAL | Status: AC

## 2014-11-30 NOTE — Addendum Note (Signed)
Addended by: Marchelle FolksMILLER, Maquita Sandoval G on: 11/30/2014 03:14 PM   Modules accepted: Orders

## 2014-11-30 NOTE — Progress Notes (Signed)
Patient ID: Laurie MolaKathrine L Huynh, female   DOB: 1981-02-10, 34 y.o.   MRN: [id number]  VAGINAL ODOR AFTER ic X 2 MONTHS- NO D/C  Chief complaint: 1.  Post coital odor 2 months.  Patient states that she has been having significant odor without itching or burning following sex.  They are not using condoms.  The orders so strong that it is causing decrease in frequency of relations.  She has been treated in the past for bacterial vaginosis.  Most recent therapies have been oral metronidazole.  OBJECTIVE: BP 94/67 mmHg  Pulse 91  Ht 5\' 5"  (1.651 m)  Wt 162 lb (73.483 kg)  BMI 26.96 kg/m2  Patient is a pleasant, well-appearing white female in no acute stress.  Flank: No CVA tenderness. Abdomen: Soft, nontender, without organomegaly. Pelvic:  Vulvar-normal .  BUS-normal.  Vagina-white discharge present; no lesions   Bimanual-no masses, no tenderness   Rectum-normal.  External exam  PROCEDURE: Wet prep- Normal saline-moderate clue cells. KOH prep-no hyphae.  IMPRESSION: 1.  BV.  PLAN: 1.  [employer]el daily 5 days. 2.  Follow-up as needed. 3.  Nu swab taken. 4.  Wet prep done.

## 2014-12-01 NOTE — Addendum Note (Signed)
Addended by: Marchelle Folks on: 12/01/2014 10:43 AM   Modules accepted: Orders

## 2014-12-06 LAB — NUSWAB BV AND CANDIDA, NAA
Atopobium vaginae: HIGH Score — AB
Megasphaera 1: HIGH Score — AB

## 2015-05-03 ENCOUNTER — Encounter: Payer: Self-pay | Admitting: *Deleted

## 2015-05-03 ENCOUNTER — Emergency Department
Admission: EM | Admit: 2015-05-03 | Discharge: 2015-05-03 | Disposition: A | Discharge status: Home / Self Care | Payer: Medicaid Other | Attending: Student | Admitting: Student

## 2015-05-03 ENCOUNTER — Emergency Department: Payer: Medicaid Other

## 2015-05-03 DIAGNOSIS — S161XXA Strain of muscle, fascia and tendon at neck level, initial encounter: Secondary | ICD-10-CM | POA: Diagnosis not present

## 2015-05-03 DIAGNOSIS — Y9389 Activity, other specified: Secondary | ICD-10-CM | POA: Diagnosis not present

## 2015-05-03 DIAGNOSIS — Z880 Allergy status to penicillin: Secondary | ICD-10-CM | POA: Diagnosis not present

## 2015-05-03 DIAGNOSIS — F17210 Nicotine dependence, cigarettes, uncomplicated: Secondary | ICD-10-CM | POA: Insufficient documentation

## 2015-05-03 DIAGNOSIS — S39012A Strain of muscle, fascia and tendon of lower back, initial encounter: Secondary | ICD-10-CM | POA: Diagnosis not present

## 2015-05-03 DIAGNOSIS — Z79899 Other long term (current) drug therapy: Secondary | ICD-10-CM | POA: Insufficient documentation

## 2015-05-03 DIAGNOSIS — Y92410 Unspecified street and highway as the place of occurrence of the external cause: Secondary | ICD-10-CM | POA: Insufficient documentation

## 2015-05-03 DIAGNOSIS — D649 Anemia, unspecified: Secondary | ICD-10-CM | POA: Diagnosis not present

## 2015-05-03 DIAGNOSIS — Y998 Other external cause status: Secondary | ICD-10-CM | POA: Insufficient documentation

## 2015-05-03 DIAGNOSIS — F419 Anxiety disorder, unspecified: Secondary | ICD-10-CM | POA: Diagnosis not present

## 2015-05-03 DIAGNOSIS — S199XXA Unspecified injury of neck, initial encounter: Secondary | ICD-10-CM | POA: Diagnosis present

## 2015-05-03 DIAGNOSIS — S0993XA Unspecified injury of face, initial encounter: Secondary | ICD-10-CM | POA: Diagnosis not present

## 2015-05-03 MED ORDER — METHOCARBAMOL 750 MG PO TABS: 1500.0000 mg | ORAL_TABLET | Freq: Four times a day (QID) | ORAL | Status: AC

## 2015-05-03 MED ORDER — OXYCODONE-ACETAMINOPHEN 5-325 MG PO TABS: 1.0000 | ORAL_TABLET | Freq: Four times a day (QID) | ORAL | Status: AC | PRN

## 2015-05-03 MED ORDER — METHOCARBAMOL 500 MG PO TABS
1000.0000 mg | ORAL_TABLET | Freq: Once | ORAL | Status: AC
  Administered 2015-05-03: 1000 mg via ORAL
  Filled 2015-05-03: qty 2

## 2015-05-03 MED ORDER — OXYCODONE-ACETAMINOPHEN 5-325 MG PO TABS
1.0000 | ORAL_TABLET | Freq: Once | ORAL | Status: AC
  Administered 2015-05-03: 1 via ORAL
  Filled 2015-05-03: qty 1
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
## 2015-05-03 NOTE — Discharge Instructions (Signed)
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
## 2017-03-18 ENCOUNTER — Emergency Department
Admission: EM | Admit: 2017-03-18 | Discharge: 2017-03-18 | Disposition: A | Discharge status: Home / Self Care | Payer: Medicaid Other | Attending: Emergency Medicine | Admitting: Emergency Medicine

## 2017-03-18 DIAGNOSIS — R6889 Other general symptoms and signs: Secondary | ICD-10-CM

## 2017-03-18 DIAGNOSIS — R69 Illness, unspecified: Secondary | ICD-10-CM | POA: Insufficient documentation

## 2017-03-18 DIAGNOSIS — Z79899 Other long term (current) drug therapy: Secondary | ICD-10-CM | POA: Diagnosis not present

## 2017-03-18 DIAGNOSIS — J029 Acute pharyngitis, unspecified: Secondary | ICD-10-CM | POA: Diagnosis present

## 2017-03-18 DIAGNOSIS — J111 Influenza due to unidentified influenza virus with other respiratory manifestations: Secondary | ICD-10-CM

## 2017-03-18 DIAGNOSIS — F17210 Nicotine dependence, cigarettes, uncomplicated: Secondary | ICD-10-CM | POA: Insufficient documentation

## 2017-03-18 LAB — POCT RAPID STREP A: STREPTOCOCCUS, GROUP A SCREEN (DIRECT): NEGATIVE

## 2017-03-18 LAB — INFLUENZA PANEL BY PCR (TYPE A & B)
Influenza A By PCR: NEGATIVE
Influenza B By PCR: NEGATIVE

## 2017-03-18 NOTE — ED Triage Notes (Signed)
Pt brought in via POV with c/o generalized body aches and flu-like symptoms.  Pt reports having a fever of 101 at home and took dayquil for that.  Pt reports that nothing is making her feel better at this time.  Pt is A&Ox4, ambulatory to triage.  Pt tearful during triage.

## 2017-03-18 NOTE — ED Notes (Signed)
Patient to lobby in wheelchair with family. NAD noted. Verbalized understanding of discharge instructions and follow-up care.

## 2017-03-18 NOTE — ED Provider Notes (Signed)
[facility] Emergency Department Provider Note  ____________________________________________  Time seen: Approximately 5:50 PM  I have reviewed the triage vital signs and the nursing notes.   HISTORY  Chief Complaint Generalized Body Aches and Sore Throat    HPI Laurie Huynh is a 36 y.o. female presents the emergency department complaining of sudden onset of fevers and chills, body aches, sore throat.  Patient reports that symptoms began abruptly 3 days prior.  Patient has tried a dose of DayQuil, NyQuil, Mucinex for symptoms without significant relief.  Tylenol or Motrin.  Patient has had a fever up to 103 F.  Patient denies any headache, visual changes, nasal congestion, ear pain, coughing, shortness of breath, chest pain, abdominal pain, nausea vomiting, diarrhea or constipation.  No other complaints at this time.  Past Medical History:  Diagnosis Date  . Abnormal Pap smear of cervix   . Anemia   . Anxiety   . Dysmenorrhea   . Endometriosis   . Fatigue   . Hematuria   . Menorrhagia   . Tobacco abuse   . Vaginitis     Patient Active Problem List   Diagnosis Date Noted  . Fatigue 11/30/2014    Past Surgical History:  Procedure Laterality Date  . CERVICAL BIOPSY  W/ LOOP ELECTRODE EXCISION     x 2  . CESAREAN SECTION     2011,2009  . LAPAROSCOPIC ABDOMINAL EXPLORATION  2015  . LAPAROSCOPY  2015   adheisoplysis and hematoma evacuation  . TUBAL LIGATION  2011  . VAGINAL HYSTERECTOMY  2015   bilateral salpingectomy    Prior to Admission medications   Medication Sig Start Date End Date Taking? Authorizing Provider  folic acid (FOLVITE) 1 MG tablet Take 1 mg by mouth daily.    [provider]  methocarbamol (ROBAXIN-750) 750 MG tablet Take 2 tablets (1,500 mg total) by mouth 4 (four) times daily. 05/03/15   Joni Reining, PA-C  metroNIDAZOLE (METROGEL VAGINAL) 0.75 % vaginal gel Place 1 Applicatorful vaginally at bedtime.  11/30/14   Defrancesco, Prentice Docker, MD  Multiple Vitamin (MULTIVITAMIN) tablet Take 1 tablet by mouth daily.    [provider]  oxyCODONE-acetaminophen (ROXICET) 5-325 MG tablet Take 1 tablet by mouth every 6 (six) hours as needed for severe pain. 05/03/15   Joni Reining, PA-C  sertraline (ZOLOFT) 50 MG tablet Take 50 mg by mouth daily.    [provider]    Allergies Penicillins  Family History  Problem Relation Age of Onset  . Diabetes Neg Hx   . Cancer Neg Hx   . Heart disease Neg Hx     Social History Social History   Tobacco Use  . Smoking status: Current Every Day Smoker    Packs/day: 1.00    Types: Cigarettes  Substance Use Topics  . Alcohol use: No  . Drug use: No     Review of Systems  Constitutional: Positive fever/chills Eyes: No visual changes. No discharge ENT: Positive for sore throat Cardiovascular: no chest pain. Respiratory: no cough. No SOB. Gastrointestinal: No abdominal pain.  No nausea, no vomiting.  No diarrhea.  No constipation. Genitourinary: Negative for dysuria. No hematuria Musculoskeletal: Negative for musculoskeletal pain. Skin: Negative for rash, abrasions, lacerations, ecchymosis. Neurological: Negative for headaches, focal weakness or numbness. 10-point ROS otherwise negative.  ____________________________________________   PHYSICAL EXAM:  VITAL SIGNS: ED Triage Vitals [03/18/17 1603]  Enc Vitals Group     BP 107/78  Pulse Rate (!) 102     Resp (!) 22     Temp 99.7 F (37.6 C)     Temp Source Oral     SpO2 100 %     Weight 160 lb (72.6 kg)     Height 5\' 5"  (1.651 m)     Head Circumference      Peak Flow      Pain Score 9     Pain Loc      Pain Edu?      Excl. in GC?      Constitutional: Alert and oriented. Well appearing and in no acute distress. Eyes: Conjunctivae are normal. PERRL. EOMI. Head: Atraumatic. ENT:      Ears: EACs unremarkable bilaterally.  TMs are dusky bilaterally but no  bulging or air-fluid level.      Nose: Mild congestion/rhinnorhea.      Mouth/Throat: Mucous membranes are moist.  Pharynx is mildly erythematous but nonedematous.  Tonsils are erythematous and slightly edematous but no exudates.  Uvula is midline  Neck: No stridor.  Neck is supple full range of motion Hematological/Lymphatic/Immunilogical: She is, mobile, nontender anterior cervical lymphadenopathy. Cardiovascular: Normal rate, regular rhythm. Normal S1 and S2.  Good peripheral circulation. Respiratory: Normal respiratory effort without tachypnea or retractions. Lungs CTAB. Good air entry to the bases with no decreased or absent breath sounds. Musculoskeletal: Full range of motion to all extremities. No gross deformities appreciated. Neurologic:  Normal speech and language. No gross focal neurologic deficits are appreciated.  Skin:  Skin is warm, dry and intact. No rash noted. Psychiatric: Mood and affect are normal. Speech and behavior are normal. Patient exhibits appropriate insight and judgement.   ____________________________________________   LABS (all labs ordered are listed, but only abnormal results are displayed)  Labs Reviewed  INFLUENZA PANEL BY PCR (TYPE A & B)  POCT RAPID STREP A   ____________________________________________  EKG   ____________________________________________  RADIOLOGY   No results found.  ____________________________________________    PROCEDURES  Procedure(s) performed:    Procedures    Medications - No data to display   ____________________________________________   INITIAL IMPRESSION / ASSESSMENT AND PLAN / ED COURSE  Pertinent labs & imaging results that were available during my care of the patient were reviewed by me and considered in my medical decision making (see chart for details).  Review of the [place] CSRS was performed in accordance of the [facility] prior to dispensing any controlled drugs.     Patient's diagnosis is  consistent with influenza-like illness.  Patient presents with sudden onset of fevers, body aches, sore throat.  Negative strep test.  Negative on the [facility] criteria.  Initial differential included viral URI, strep, influenza.  Patient did have influenza PCR testing but results were predischarge.  Patient is outside of Tamiflu window.  Patient is encouraged to drink plenty of fluids, Tylenol and Motrin and plenty of rest..  No prescriptions at this time.  Patient will follow primary care as needed.  Patient is given ED precautions to return to the ED for any worsening or new symptoms.     ____________________________________________  FINAL CLINICAL IMPRESSION(S) / ED DIAGNOSES  Final diagnoses:  Flu-like symptoms  Influenza-like illness      NEW MEDICATIONS STARTED DURING THIS VISIT:  ED Discharge Orders    None          This chart was dictated using voice recognition software/Dragon. Despite best efforts to proofread, errors can occur which can change the meaning.  Any change was purely unintentional.    Racheal PatchesCuthriell, Jonathan D, PA-C 03/18/17 1924    Loleta RoseForbach, Cory, MD 03/18/17 2131

## 2017-08-07 ENCOUNTER — Emergency Department
Admission: EM | Admit: 2017-08-07 | Discharge: 2017-08-07 | Disposition: A | Discharge status: Home / Self Care | Payer: Medicaid Other | Attending: Emergency Medicine | Admitting: Emergency Medicine

## 2017-08-07 ENCOUNTER — Other Ambulatory Visit: Payer: Self-pay

## 2017-08-07 DIAGNOSIS — Z23 Encounter for immunization: Secondary | ICD-10-CM | POA: Insufficient documentation

## 2017-08-07 DIAGNOSIS — Y929 Unspecified place or not applicable: Secondary | ICD-10-CM | POA: Diagnosis not present

## 2017-08-07 DIAGNOSIS — S61012A Laceration without foreign body of left thumb without damage to nail, initial encounter: Secondary | ICD-10-CM | POA: Insufficient documentation

## 2017-08-07 DIAGNOSIS — Z79899 Other long term (current) drug therapy: Secondary | ICD-10-CM | POA: Insufficient documentation

## 2017-08-07 DIAGNOSIS — W260XXA Contact with knife, initial encounter: Secondary | ICD-10-CM | POA: Insufficient documentation

## 2017-08-07 DIAGNOSIS — S6992XA Unspecified injury of left wrist, hand and finger(s), initial encounter: Secondary | ICD-10-CM | POA: Diagnosis present

## 2017-08-07 DIAGNOSIS — F17210 Nicotine dependence, cigarettes, uncomplicated: Secondary | ICD-10-CM | POA: Diagnosis not present

## 2017-08-07 DIAGNOSIS — Y999 Unspecified external cause status: Secondary | ICD-10-CM | POA: Insufficient documentation

## 2017-08-07 DIAGNOSIS — Y939 Activity, unspecified: Secondary | ICD-10-CM | POA: Diagnosis not present

## 2017-08-07 MED ORDER — LIDOCAINE HCL (PF) 1 % IJ SOLN
5.0000 mL | Freq: Once | INTRAMUSCULAR | Status: AC
  Administered 2017-08-07: 5 mL
  Filled 2017-08-07: qty 5

## 2017-08-07 MED ORDER — TETANUS-DIPHTH-ACELL PERTUSSIS 5-2.5-18.5 LF-MCG/0.5 IM SUSP
0.5000 mL | Freq: Once | INTRAMUSCULAR | Status: AC
  Administered 2017-08-07: 0.5 mL via INTRAMUSCULAR
  Filled 2017-08-07: qty 0.5

## 2017-08-07 NOTE — Discharge Instructions (Signed)
Keep the wound clean, dry, and covered. Follow-up with your provider for suture removal in 7-10 days.  °

## 2017-08-07 NOTE — ED Provider Notes (Signed)
[facility] Emergency Department Provider Note ____________________________________________  Time seen: 1338  I have reviewed the triage vital signs and the nursing notes.  HISTORY  Chief Complaint  Laceration  HPI Laurie Huynh is a 37 y.o. female to the ED for evaluation of an accidental laceration to her left thumb.  Patient describes cutting her thumb with a box cutting razor.  She denies any other injury at this time.  She reports an out of date tetanus status.    Past Medical History:  Diagnosis Date  . Abnormal Pap smear of cervix   . Anemia   . Anxiety   . Dysmenorrhea   . Endometriosis   . Fatigue   . Hematuria   . Menorrhagia   . Tobacco abuse   . Vaginitis     Patient Active Problem List   Diagnosis Date Noted  . Fatigue 11/30/2014    Past Surgical History:  Procedure Laterality Date  . CERVICAL BIOPSY  W/ LOOP ELECTRODE EXCISION     x 2  . CESAREAN SECTION     2011,2009  . LAPAROSCOPIC ABDOMINAL EXPLORATION  2015  . LAPAROSCOPY  2015   adheisoplysis and hematoma evacuation  . TUBAL LIGATION  2011  . VAGINAL HYSTERECTOMY  2015   bilateral salpingectomy    Prior to Admission medications   Medication Sig Start Date End Date Taking? Authorizing Provider  folic acid (FOLVITE) 1 MG tablet Take 1 mg by mouth daily.    [provider]  methocarbamol (ROBAXIN-750) 750 MG tablet Take 2 tablets (1,500 mg total) by mouth 4 (four) times daily. 05/03/15   Joni Reining, PA-C  metroNIDAZOLE (METROGEL VAGINAL) 0.75 % vaginal gel Place 1 Applicatorful vaginally at bedtime. 11/30/14   Defrancesco, Prentice Docker, MD  Multiple Vitamin (MULTIVITAMIN) tablet Take 1 tablet by mouth daily.    [provider]  oxyCODONE-acetaminophen (ROXICET) 5-325 MG tablet Take 1 tablet by mouth every 6 (six) hours as needed for severe pain. 05/03/15   Joni Reining, PA-C  sertraline (ZOLOFT) 50 MG tablet Take 50 mg by mouth daily.    [provider]    Allergies Penicillins  Family History  Problem Relation Age of Onset  . Diabetes Neg Hx   . Cancer Neg Hx   . Heart disease Neg Hx     Social History Social History   Tobacco Use  . Smoking status: Current Every Day Smoker    Packs/day: 1.00    Types: Cigarettes  . Smokeless tobacco: Never Used  Substance Use Topics  . Alcohol use: No  . Drug use: No    Review of Systems  Constitutional: Negative for fever. Cardiovascular: Negative for chest pain. Respiratory: Negative for shortness of breath. Musculoskeletal: Negative for back pain. Skin: Negative for rash.  Left thumb laceration as above. Neurological: Negative for headaches, focal weakness or numbness. ____________________________________________  PHYSICAL EXAM:  VITAL SIGNS: ED Triage Vitals [08/07/17 1324]  Enc Vitals Group     BP 110/66     Pulse Rate 66     Resp 16     Temp 98.1 F (36.7 C)     Temp Source Oral     SpO2 98 %     Weight 143 lb (64.9 kg)     Height 5\' 5"  (1.651 m)     Head Circumference      Peak Flow      Pain Score 7     Pain Loc  Pain Edu?      Excl. in GC?     Constitutional: Alert and oriented. Well appearing and in no distress. Head: Normocephalic and atraumatic. Cardiovascular: Normal rate, regular rhythm. Normal distal pulses. Respiratory: Normal respiratory effort. No wheezes/rales/rhonchi. Musculoskeletal: Composite fist on the left hand.  Nontender with normal range of motion in all extremities.  Neurologic:  Normal gross sensation. Normal speech and language. No gross focal neurologic deficits are appreciated. Skin:  Skin is warm, dry and intact. No rash noted.  She with a single linear laceration to the palmar fat pad of the thumb.  No active bleeding is noted at this time. ____________________________________________  PROCEDURES  Tdap 0.5 ml IM  .Marland Kitchen.Laceration Repair Date/Time: 08/07/2017 6:51 PM Performed by: Lissa HoardMenshew, Ivon Roedel V Bacon,  PA-C Authorized by: Lissa HoardMenshew, Keiosha Cancro V Bacon, PA-C   Consent:    Consent obtained:  Verbal   Consent given by:  Patient   Risks discussed:  Poor wound healing Anesthesia (see MAR for exact dosages):    Anesthesia method:  Nerve block   Block needle gauge:  27 G   Block anesthetic:  Lidocaine 1% w/o epi   Block injection procedure:  Anatomic landmarks identified and introduced needle   Block outcome:  Anesthesia achieved Laceration details:    Location:  Finger   Finger location:  L thumb   Length (cm):  2 Repair type:    Repair type:  Simple Pre-procedure details:    Preparation:  Patient was prepped and draped in usual sterile fashion Treatment:    Area cleansed with:  Betadine   Amount of cleaning:  Standard Skin repair:    Repair method:  Sutures   Suture size:  5-0   Suture material:  Nylon   Suture technique:  Simple interrupted   Number of sutures:  3 Approximation:    Approximation:  Close Post-procedure details:    Dressing:  Non-adherent dressing   Patient tolerance of procedure:  Tolerated well, no immediate complications  ____________________________________________  INITIAL IMPRESSION / ASSESSMENT AND PLAN / ED COURSE  Patient with ED evaluation of an accidental laceration to the left thumb.  Patient is repaired with a suture Mosher.  Wound care instructions are provided.  She will see her PCP for suture removal in 7-10 days. ____________________________________________  FINAL CLINICAL IMPRESSION(S) / ED DIAGNOSES  Final diagnoses:  Laceration of left thumb without foreign body without damage to nail, initial encounter      Lissa HoardMenshew, Aisha Greenberger V Bacon, PA-C 08/07/17 1853    Phineas SemenGoodman, Graydon, MD 08/07/17 [phone]

## 2017-08-07 NOTE — ED Triage Notes (Signed)
Pt states she accidentally cut her left thumb with a razor blade. Bleeding controlled

## 2018-05-26 ENCOUNTER — Emergency Department
Admission: EM | Admit: 2018-05-26 | Discharge: 2018-05-26 | Disposition: A | Discharge status: Home / Self Care | Payer: Medicaid Other | Attending: Emergency Medicine | Admitting: Emergency Medicine

## 2018-05-26 ENCOUNTER — Other Ambulatory Visit: Payer: Self-pay

## 2018-05-26 DIAGNOSIS — Z79899 Other long term (current) drug therapy: Secondary | ICD-10-CM | POA: Diagnosis not present

## 2018-05-26 DIAGNOSIS — F17210 Nicotine dependence, cigarettes, uncomplicated: Secondary | ICD-10-CM | POA: Diagnosis not present

## 2018-05-26 DIAGNOSIS — L02411 Cutaneous abscess of right axilla: Secondary | ICD-10-CM | POA: Insufficient documentation

## 2018-05-26 DIAGNOSIS — L02419 Cutaneous abscess of limb, unspecified: Secondary | ICD-10-CM

## 2018-05-26 MED ORDER — HYDROCODONE-ACETAMINOPHEN 5-325 MG PO TABS
1.0000 | ORAL_TABLET | Freq: Once | ORAL | Status: AC
  Administered 2018-05-26: 1 via ORAL
  Filled 2018-05-26: qty 1

## 2018-05-26 MED ORDER — KETOROLAC TROMETHAMINE 30 MG/ML IJ SOLN
30.0000 mg | Freq: Once | INTRAMUSCULAR | Status: AC
  Administered 2018-05-26: 30 mg via INTRAMUSCULAR
  Filled 2018-05-26: qty 1

## 2018-05-26 MED ORDER — SULFAMETHOXAZOLE-TRIMETHOPRIM 800-160 MG PO TABS: 1.0000 | ORAL_TABLET | Freq: Two times a day (BID) | ORAL | 0 refills | Status: AC

## 2018-05-26 MED ORDER — LIDOCAINE HCL 1 % IJ SOLN
5.0000 mL | Freq: Once | INTRAMUSCULAR | Status: AC
  Administered 2018-05-26: 5 mL
  Filled 2018-05-26: qty 10

## 2018-05-26 NOTE — ED Notes (Signed)
See triage note   Presents with possible abscess area under right arm   

## 2018-05-26 NOTE — ED Triage Notes (Signed)
R axilla abscess x 1 week. Draining some. A&O, ambulatory.

## 2018-05-26 NOTE — ED Provider Notes (Signed)
[facility] Emergency Department Provider Note  ____________________________________________  Time seen: Approximately 9:25 PM  I have reviewed the triage vital signs and the nursing notes.   HISTORY  Chief Complaint Abscess    HPI Laurie Huynh is a 38 y.o. female presents to the emergency department with a right axillary abscess that is been present for the past week.  Patient reports increased drainage that occurred several days ago but has since stopped.  Patient has had some malaise and intermittent chills.  She has never had a cutaneous abscess in the past.  No other alleviating measures have been attempted.  Past Medical History:  Diagnosis Date  . Abnormal Pap smear of cervix   . Anemia   . Anxiety   . Dysmenorrhea   . Endometriosis   . Fatigue   . Hematuria   . Menorrhagia   . Tobacco abuse   . Vaginitis     Patient Active Problem List   Diagnosis Date Noted  . Fatigue 11/30/2014    Past Surgical History:  Procedure Laterality Date  . CERVICAL BIOPSY  W/ LOOP ELECTRODE EXCISION     x 2  . CESAREAN SECTION     2011,2009  . LAPAROSCOPIC ABDOMINAL EXPLORATION  2015  . LAPAROSCOPY  2015   adheisoplysis and hematoma evacuation  . TUBAL LIGATION  2011  . VAGINAL HYSTERECTOMY  2015   bilateral salpingectomy    Prior to Admission medications   Medication Sig Start Date End Date Taking? Authorizing Provider  folic acid (FOLVITE) 1 MG tablet Take 1 mg by mouth daily.    [provider]  methocarbamol (ROBAXIN-750) 750 MG tablet Take 2 tablets (1,500 mg total) by mouth 4 (four) times daily. 05/03/15   Joni Reining, PA-C  metroNIDAZOLE (METROGEL VAGINAL) 0.75 % vaginal gel Place 1 Applicatorful vaginally at bedtime. 11/30/14   Defrancesco, Prentice Docker, MD  Multiple Vitamin (MULTIVITAMIN) tablet Take 1 tablet by mouth daily.    [provider]  oxyCODONE-acetaminophen (ROXICET) 5-325 MG tablet Take 1 tablet by mouth  every 6 (six) hours as needed for severe pain. 05/03/15   Joni Reining, PA-C  sertraline (ZOLOFT) 50 MG tablet Take 50 mg by mouth daily.    [provider]  sulfamethoxazole-trimethoprim (BACTRIM DS,SEPTRA DS) 800-160 MG tablet Take 1 tablet by mouth 2 (two) times daily for 7 days. 05/26/18 06/02/18  Orvil Feil, PA-C    Allergies Penicillins  Family History  Problem Relation Age of Onset  . Diabetes Neg Hx   . Cancer Neg Hx   . Heart disease Neg Hx     Social History Social History   Tobacco Use  . Smoking status: Current Every Day Smoker    Packs/day: 1.00    Types: Cigarettes  . Smokeless tobacco: Never Used  Substance Use Topics  . Alcohol use: No  . Drug use: No     Review of Systems  Constitutional: No fever/chills Eyes: No visual changes. No discharge ENT: No upper respiratory complaints. Cardiovascular: no chest pain. Respiratory: no cough. No SOB. Gastrointestinal: No abdominal pain.  No nausea, no vomiting.  No diarrhea.  No constipation. Genitourinary: Negative for dysuria. No hematuria Musculoskeletal: Negative for musculoskeletal pain. Skin: Patient has right axillary abscess.  Neurological: Negative for headaches, focal weakness or numbness.  ____________________________________________   PHYSICAL EXAM:  VITAL SIGNS: ED Triage Vitals [05/26/18 1803]  Enc Vitals Group     BP 109/64     Pulse Rate  87     Resp 18     Temp 97.8 F (36.6 C)     Temp Source Oral     SpO2 100 %     Weight 140 lb (63.5 kg)     Height 5\' 5"  (1.651 m)     Head Circumference      Peak Flow      Pain Score 10     Pain Loc      Pain Edu?      Excl. in GC?      Constitutional: Alert and oriented. Well appearing and in no acute distress. Eyes: Conjunctivae are normal. PERRL. EOMI. Head: Atraumatic. Cardiovascular: Normal rate, regular rhythm. Normal S1 and S2.  Good peripheral circulation. Respiratory: Normal respiratory effort without tachypnea or  retractions. Lungs CTAB. Good air entry to the bases with no decreased or absent breath sounds. Musculoskeletal: Full range of motion to all extremities. No gross deformities appreciated. Neurologic:  Normal speech and language. No gross focal neurologic deficits are appreciated.  Skin: Patient has 3 cm x 3 cm palpable right axillary abscess with associated induration and fluctuance with 2 cm surrounding cellulitis.  Psychiatric: Mood and affect are normal. Speech and behavior are normal. Patient exhibits appropriate insight and judgement.   ____________________________________________   LABS (all labs ordered are listed, but only abnormal results are displayed)  Labs Reviewed - No data to display ____________________________________________  EKG   ____________________________________________  RADIOLOGY   No results found.  ____________________________________________    PROCEDURES  Procedure(s) performed:    Procedures  INCISION AND DRAINAGE Performed by: Orvil FeilJaclyn M Samreen Seltzer Consent: Verbal consent obtained. Risks and benefits: risks, benefits and alternatives were discussed Type: abscess  Body area: Right axilla   Anesthesia: local infiltration  Incision was made with a scalpel.  Local anesthetic: lidocaine 1% without epinephrine  Anesthetic total: 3 ml  Complexity: complex Blunt dissection to break up loculations  Drainage: purulent  Drainage amount: Copious   Packing material: 1/4 in iodoform gauze  Patient tolerance: Patient tolerated the procedure well with no immediate complications.      Medications  lidocaine (XYLOCAINE) 1 % (with pres) injection 5 mL (5 mLs Infiltration Given 05/26/18 2105)  ketorolac (TORADOL) 30 MG/ML injection 30 mg (30 mg Intramuscular Given 05/26/18 2101)  HYDROcodone-acetaminophen (NORCO/VICODIN) 5-325 MG per tablet 1 tablet (1 tablet Oral Given 05/26/18 2104)      ____________________________________________   INITIAL IMPRESSION / ASSESSMENT AND PLAN / ED COURSE  Pertinent labs & imaging results that were available during my care of the patient were reviewed by me and considered in my medical decision making (see chart for details).  Review of the [place] CSRS was performed in accordance of the [facility] prior to dispensing any controlled drugs.    Assessment and Plan:  Right axillary abscess Patient presents to the emergency department with a 3 cm x 3 cm right axillary abscess.  Patient underwent incision and drainage in the emergency department without complication.  Patient was advised to remove packing in 2 days.  She was discharged with Bactrim.  Strict return precautions were given to return with new or worsening symptoms.  All patient questions were answered.    ____________________________________________  FINAL CLINICAL IMPRESSION(S) / ED DIAGNOSES  Final diagnoses:  Axillary abscess      NEW MEDICATIONS STARTED DURING THIS VISIT:  ED Discharge Orders         Ordered    sulfamethoxazole-trimethoprim (BACTRIM DS,SEPTRA DS) 800-160 MG tablet  2  times daily     05/26/18 2050              This chart was dictated using voice recognition software/Dragon. Despite best efforts to proofread, errors can occur which can change the meaning. Any change was purely unintentional.    Gasper LloydWoods, Djeneba Barsch M, PA-C 05/26/18 2130    Myrna BlazerSchaevitz, David Matthew, MD 05/26/18 2252

## 2019-07-28 ENCOUNTER — Other Ambulatory Visit: Payer: Self-pay

## 2019-07-28 ENCOUNTER — Emergency Department
Admission: EM | Admit: 2019-07-28 | Discharge: 2019-07-28 | Disposition: A | Discharge status: Home / Self Care | Payer: Medicaid Other | Attending: Emergency Medicine | Admitting: Emergency Medicine

## 2019-07-28 DIAGNOSIS — L989 Disorder of the skin and subcutaneous tissue, unspecified: Secondary | ICD-10-CM | POA: Diagnosis present

## 2019-07-28 DIAGNOSIS — F17210 Nicotine dependence, cigarettes, uncomplicated: Secondary | ICD-10-CM | POA: Diagnosis not present

## 2019-07-28 DIAGNOSIS — L539 Erythematous condition, unspecified: Secondary | ICD-10-CM | POA: Insufficient documentation

## 2019-07-28 DIAGNOSIS — Z79899 Other long term (current) drug therapy: Secondary | ICD-10-CM | POA: Diagnosis not present

## 2019-07-28 DIAGNOSIS — L03211 Cellulitis of face: Secondary | ICD-10-CM

## 2019-07-28 MED ORDER — CLINDAMYCIN HCL 300 MG PO CAPS: 300.0000 mg | ORAL_CAPSULE | Freq: Four times a day (QID) | ORAL | 0 refills | Status: AC

## 2019-07-28 MED ORDER — CLINDAMYCIN HCL 150 MG PO CAPS
300.0000 mg | ORAL_CAPSULE | Freq: Once | ORAL | Status: AC
  Administered 2019-07-28: 300 mg via ORAL
  Filled 2019-07-28: qty 2

## 2019-07-28 MED ORDER — OXYCODONE-ACETAMINOPHEN 5-325 MG PO TABS
1.0000 | ORAL_TABLET | Freq: Once | ORAL | Status: AC
  Administered 2019-07-28: 1 via ORAL
  Filled 2019-07-28: qty 1

## 2019-07-28 NOTE — ED Provider Notes (Signed)
[facility] Emergency Department Provider Note  ____________________________________________  Time seen: Approximately 7:35 PM  I have reviewed the triage vital signs and the nursing notes.   HISTORY  Chief Complaint Insect Bite    HPI Laurie Huynh is a 39 y.o. female who presents the emergency department complaining of a lesion to the right jawline.  Patient reports that 2 days ago she started noticing an erythematous, edematous, tender lesion along the jawline.  She has no dental pain.  She states that she has no history of recurring skin infection.  Patient has had one abscess in her life to the right axilla.  States that the area was increasing in size, erythema and edema presents the emergency department for evaluation.  No drainage.  Patient denies any fevers or chills, URI complaints, nausea, vomiting.         Past Medical History:  Diagnosis Date  . Abnormal Pap smear of cervix   . Anemia   . Anxiety   . Dysmenorrhea   . Endometriosis   . Fatigue   . Hematuria   . Menorrhagia   . Tobacco abuse   . Vaginitis     Patient Active Problem List   Diagnosis Date Noted  . Fatigue 11/30/2014    Past Surgical History:  Procedure Laterality Date  . CERVICAL BIOPSY  W/ LOOP ELECTRODE EXCISION     x 2  . CESAREAN SECTION     2011,2009  . LAPAROSCOPIC ABDOMINAL EXPLORATION  2015  . LAPAROSCOPY  2015   adheisoplysis and hematoma evacuation  . TUBAL LIGATION  2011  . VAGINAL HYSTERECTOMY  2015   bilateral salpingectomy    Prior to Admission medications   Medication Sig Start Date End Date Taking? Authorizing Provider  clindamycin (CLEOCIN) 300 MG capsule Take 1 capsule (300 mg total) by mouth 4 (four) times daily. 07/28/19   Adellyn Capek, Charline Bills, PA-C  folic acid (FOLVITE) 1 MG tablet Take 1 mg by mouth daily.    [provider]  methocarbamol (ROBAXIN-750) 750 MG tablet Take 2 tablets (1,500 mg total) by mouth 4 (four) times  daily. 05/03/15   Sable Feil, PA-C  metroNIDAZOLE (METROGEL VAGINAL) 0.75 % vaginal gel Place 1 Applicatorful vaginally at bedtime. 11/30/14   Defrancesco, Alanda Slim, MD  Multiple Vitamin (MULTIVITAMIN) tablet Take 1 tablet by mouth daily.    [provider]  oxyCODONE-acetaminophen (ROXICET) 5-325 MG tablet Take 1 tablet by mouth every 6 (six) hours as needed for severe pain. 05/03/15   Sable Feil, PA-C  sertraline (ZOLOFT) 50 MG tablet Take 50 mg by mouth daily.    [provider]    Allergies Penicillins  Family History  Problem Relation Age of Onset  . Diabetes Neg Hx   . Cancer Neg Hx   . Heart disease Neg Hx     Social History Social History   Tobacco Use  . Smoking status: Current Every Day Smoker    Packs/day: 1.00    Types: Cigarettes  . Smokeless tobacco: Never Used  Substance Use Topics  . Alcohol use: No  . Drug use: No     Review of Systems  Constitutional: No fever/chills Eyes: No visual changes. No discharge ENT: No upper respiratory complaints. Cardiovascular: no chest pain. Respiratory: no cough. No SOB. Gastrointestinal: No abdominal pain.  No nausea, no vomiting.  No diarrhea.  No constipation. Musculoskeletal: Negative for musculoskeletal pain. Skin: Positive for erythematous, edematous, painful lesion along the right jawline  Neurological: Negative for headaches, focal weakness or numbness. 10-point ROS otherwise negative.  ____________________________________________   PHYSICAL EXAM:  VITAL SIGNS: ED Triage Vitals  Enc Vitals Group     BP 07/28/19 1712 (!) 121/93     Pulse Rate 07/28/19 1712 89     Resp 07/28/19 1712 18     Temp 07/28/19 1712 99 F (37.2 C)     Temp Source 07/28/19 1712 Oral     SpO2 07/28/19 1712 98 %     Weight 07/28/19 1711 147 lb (66.7 kg)     Height 07/28/19 1711 5\' 5"  (1.651 m)     Head Circumference --      Peak Flow --      Pain Score 07/28/19 1711 6     Pain Loc --      Pain Edu?  --      Excl. in GC? --      Constitutional: Alert and oriented. Well appearing and in no acute distress. Eyes: Conjunctivae are normal. PERRL. EOMI. Head: Atraumatic. ENT:      Ears:       Nose: No congestion/rhinnorhea.      Mouth/Throat: Mucous membranes are moist.  Neck: No stridor.  No erythema, edema, tenderness to palpation in the submandibular, submental, anterior neck. Hematological/Lymphatic/Immunilogical: No cervical lymphadenopathy. Cardiovascular: Normal rate, regular rhythm. Normal S1 and S2.  Good peripheral circulation. Respiratory: Normal respiratory effort without tachypnea or retractions. Lungs CTAB. Good air entry to the bases with no decreased or absent breath sounds. Musculoskeletal: Full range of motion to all extremities. No gross deformities appreciated. Neurologic:  Normal speech and language. No gross focal neurologic deficits are appreciated.  Skin:  Skin is warm, dry and intact. No rash noted.  Visualization of the right face reveals superficial scab-like area along the right mandibular region.  Small surrounding erythema measuring approximately 1 cm in diameter.  This area is tender, firm to palpation.  No fluctuance or induration.  When observing the anterior aspect of the mouth there is no accompanying erythema, edema of the oral tissues.  No evidence of dental infection.  No regional lymphadenopathy Psychiatric: Mood and affect are normal. Speech and behavior are normal. Patient exhibits appropriate insight and judgement.   ____________________________________________   LABS (all labs ordered are listed, but only abnormal results are displayed)  Labs Reviewed - No data to display ____________________________________________  EKG   ____________________________________________  RADIOLOGY   No results found.  ____________________________________________    PROCEDURES  Procedure(s) performed:    Procedures    Medications  clindamycin  (CLEOCIN) capsule 300 mg (has no administration in time range)  oxyCODONE-acetaminophen (PERCOCET/ROXICET) 5-325 MG per tablet 1 tablet (has no administration in time range)     ____________________________________________   INITIAL IMPRESSION / ASSESSMENT AND PLAN / ED COURSE  Pertinent labs & imaging results that were available during my care of the patient were reviewed by me and considered in my medical decision making (see chart for details).  Review of the [place] CSRS was performed in accordance of the [facility] prior to dispensing any controlled drugs.           Patient's diagnosis is consistent with facial cellulitis.  Patient presented to the emergency department the lesion along the right mandible.  Findings are consistent with cellulitis with firmness of the skin with no fluctuance.  No drainage from the site.  No evidence of dental abscess.  No evidence of extension of erythema, edema or tenderness into the submandibular,  submental, anterior neck region.  At this time, it does not appear that this has formed an abscess.  Findings are most consistent with cellulitis.  I will treat the patient with clindamycin.  Patient is encouraged to use a probiotic while on the antibiotic.  Return precautions are discussed with the patient.  Follow-up primary care as needed.  Patient is given ED precautions to return to the ED for any worsening or new symptoms.     ____________________________________________  FINAL CLINICAL IMPRESSION(S) / ED DIAGNOSES  Final diagnoses:  Facial cellulitis      NEW MEDICATIONS STARTED DURING THIS VISIT:  ED Discharge Orders         Ordered    clindamycin (CLEOCIN) 300 MG capsule  4 times daily     07/28/19 1939              This chart was dictated using voice recognition software/Dragon. Despite best efforts to proofread, errors can occur which can change the meaning. Any change was purely unintentional.    Racheal Patches,  PA-C 07/28/19 1950    Sharman Cheek, MD 07/28/19 2252

## 2019-07-28 NOTE — ED Triage Notes (Signed)
Pt comes POV with a new bump/insect bite/abscess on the right side of her chin. Pt states it came up about 2 nights ago and feels like previous abscess that was under her arm.

## 2019-07-28 NOTE — ED Notes (Signed)
See triage note. Pt with obvious edema juse below lower right aspect of her lip. Small scab at center of possible abscess. Pt denies fever/chills, drainage at site. Sx began approx 2 days ago. Pt has had similar lesions in her "right arm pit". Pt has no difficulty opening her mouth or chewing.

## 2019-10-28 ENCOUNTER — Ambulatory Visit: Payer: Self-pay | Admitting: *Deleted

## 2019-10-28 ENCOUNTER — Other Ambulatory Visit: Payer: Self-pay

## 2019-10-28 ENCOUNTER — Emergency Department
Admission: EM | Admit: 2019-10-28 | Discharge: 2019-10-28 | Disposition: A | Discharge status: Home / Self Care | Payer: Medicaid Other | Attending: Emergency Medicine | Admitting: Emergency Medicine

## 2019-10-28 ENCOUNTER — Emergency Department: Payer: Medicaid Other

## 2019-10-28 DIAGNOSIS — R0789 Other chest pain: Secondary | ICD-10-CM | POA: Diagnosis not present

## 2019-10-28 DIAGNOSIS — Z5321 Procedure and treatment not carried out due to patient leaving prior to being seen by health care provider: Secondary | ICD-10-CM | POA: Diagnosis not present

## 2019-10-28 DIAGNOSIS — R0602 Shortness of breath: Secondary | ICD-10-CM | POA: Diagnosis not present

## 2019-10-28 LAB — COMPREHENSIVE METABOLIC PANEL
ALT: 11 U/L (ref 0–44)
AST: 17 U/L (ref 15–41)
Albumin: 4 g/dL (ref 3.5–5.0)
Alkaline Phosphatase: 58 U/L (ref 38–126)
Anion gap: 10 (ref 5–15)
BUN: 11 mg/dL (ref 6–20)
CO2: 27 mmol/L (ref 22–32)
Calcium: 8.9 mg/dL (ref 8.9–10.3)
Chloride: 103 mmol/L (ref 98–111)
Creatinine, Ser: 0.71 mg/dL (ref 0.44–1.00)
GFR calc Af Amer: >60 mL/min (ref >60)
GFR calc non Af Amer: >60 mL/min (ref >60)
Glucose, Bld: 98 mg/dL (ref 70–99)
Potassium: 3.9 mmol/L (ref 3.5–5.1)
Sodium: 140 mmol/L (ref 135–145)
Total Bilirubin: 0.5 mg/dL (ref 0.3–1.2)
Total Protein: 7.8 g/dL (ref 6.5–8.1)

## 2019-10-28 LAB — CBC
HCT: 39.4 % (ref 36.0–46.0)
Hemoglobin: 13.2 g/dL (ref 12.0–15.0)
MCH: 30.8 pg (ref 26.0–34.0)
MCHC: 33.5 g/dL (ref 30.0–36.0)
MCV: 91.8 fL (ref 80.0–100.0)
Platelets: 238 10*3/uL (ref 150–400)
RBC: 4.29 MIL/uL (ref 3.87–5.11)
RDW: 13 % (ref 11.5–15.5)
WBC: 14.9 10*3/uL — ABNORMAL HIGH (ref 4.0–10.5)
nRBC: 0 % (ref 0.0–0.2)

## 2019-10-28 LAB — TROPONIN I (HIGH SENSITIVITY): Troponin I (High Sensitivity): 2 ng/L (ref <18)

## 2019-10-28 NOTE — ED Triage Notes (Signed)
Pt in with co shob and cp for 2 weeks. States pain worse when she takes a deep breath or coughs. Denies any fever or hx of cardiac problems.

## 2019-10-28 NOTE — Telephone Encounter (Signed)
Pt reports non-productive cough x 2 weeks, severe. Also reports chest tightness, pain with inspiration and SOB at rest. SOB worsens with exertion, but "I can just be sitting there and can't get my breath." States has tried  Multiple and various OTC meds, ineffective. Denies fever. No known covid exposure. Directed to ED. States will follow disposition. BF will drive.   Reason for Disposition  Chest pain  (Exception: MILD central chest pain, present only when coughing)  Answer Assessment - Initial Assessment Questions 1. ONSET: "When did the cough begin?"      2 weeks ago 2. SEVERITY: "How bad is the cough today?"      severe 3. RESPIRATORY DISTRESS: "Describe your breathing."      SOB at rest, worse with exertion 4. FEVER: "Do you have a fever?" If so, ask: "What is your temperature, how was it measured, and when did it start?"    No 5. HEMOPTYSIS: "Are you coughing up any blood?" If so ask: "How much?" (flecks, streaks, tablespoons, etc.)     no 6. TREATMENT: "What have you done so far to treat the cough?" (e.g., meds, fluids, humidifier)    OTC meds 7. CARDIAC HISTORY: "Do you have any history of heart disease?" (e.g., heart attack, congestive heart failure)      *No Answer* 8. LUNG HISTORY: "Do you have any history of lung disease?"  (e.g., pulmonary embolus, asthma, emphysema)     *No Answer* 9. PE RISK FACTORS: "Do you have a history of blood clots?" (or: recent major surgery, recent prolonged travel, bedridden)     *No Answer* 10. OTHER SYMPTOMS: "Do you have any other symptoms? (e.g., runny nose, wheezing, chest pain)      Chest tightness, pain when you take a deep breath  Protocols used: COUGH - ACUTE NON-PRODUCTIVE-A-AH

## 2020-11-01 IMAGING — CR DG CHEST 2V
2 series · 2 of 2 positions shown · non-contrast
Comparison: 06/22/2009

CLINICAL DATA: Chest pain and short of breath

EXAM:
CHEST - 2 VIEW

[chest pa]
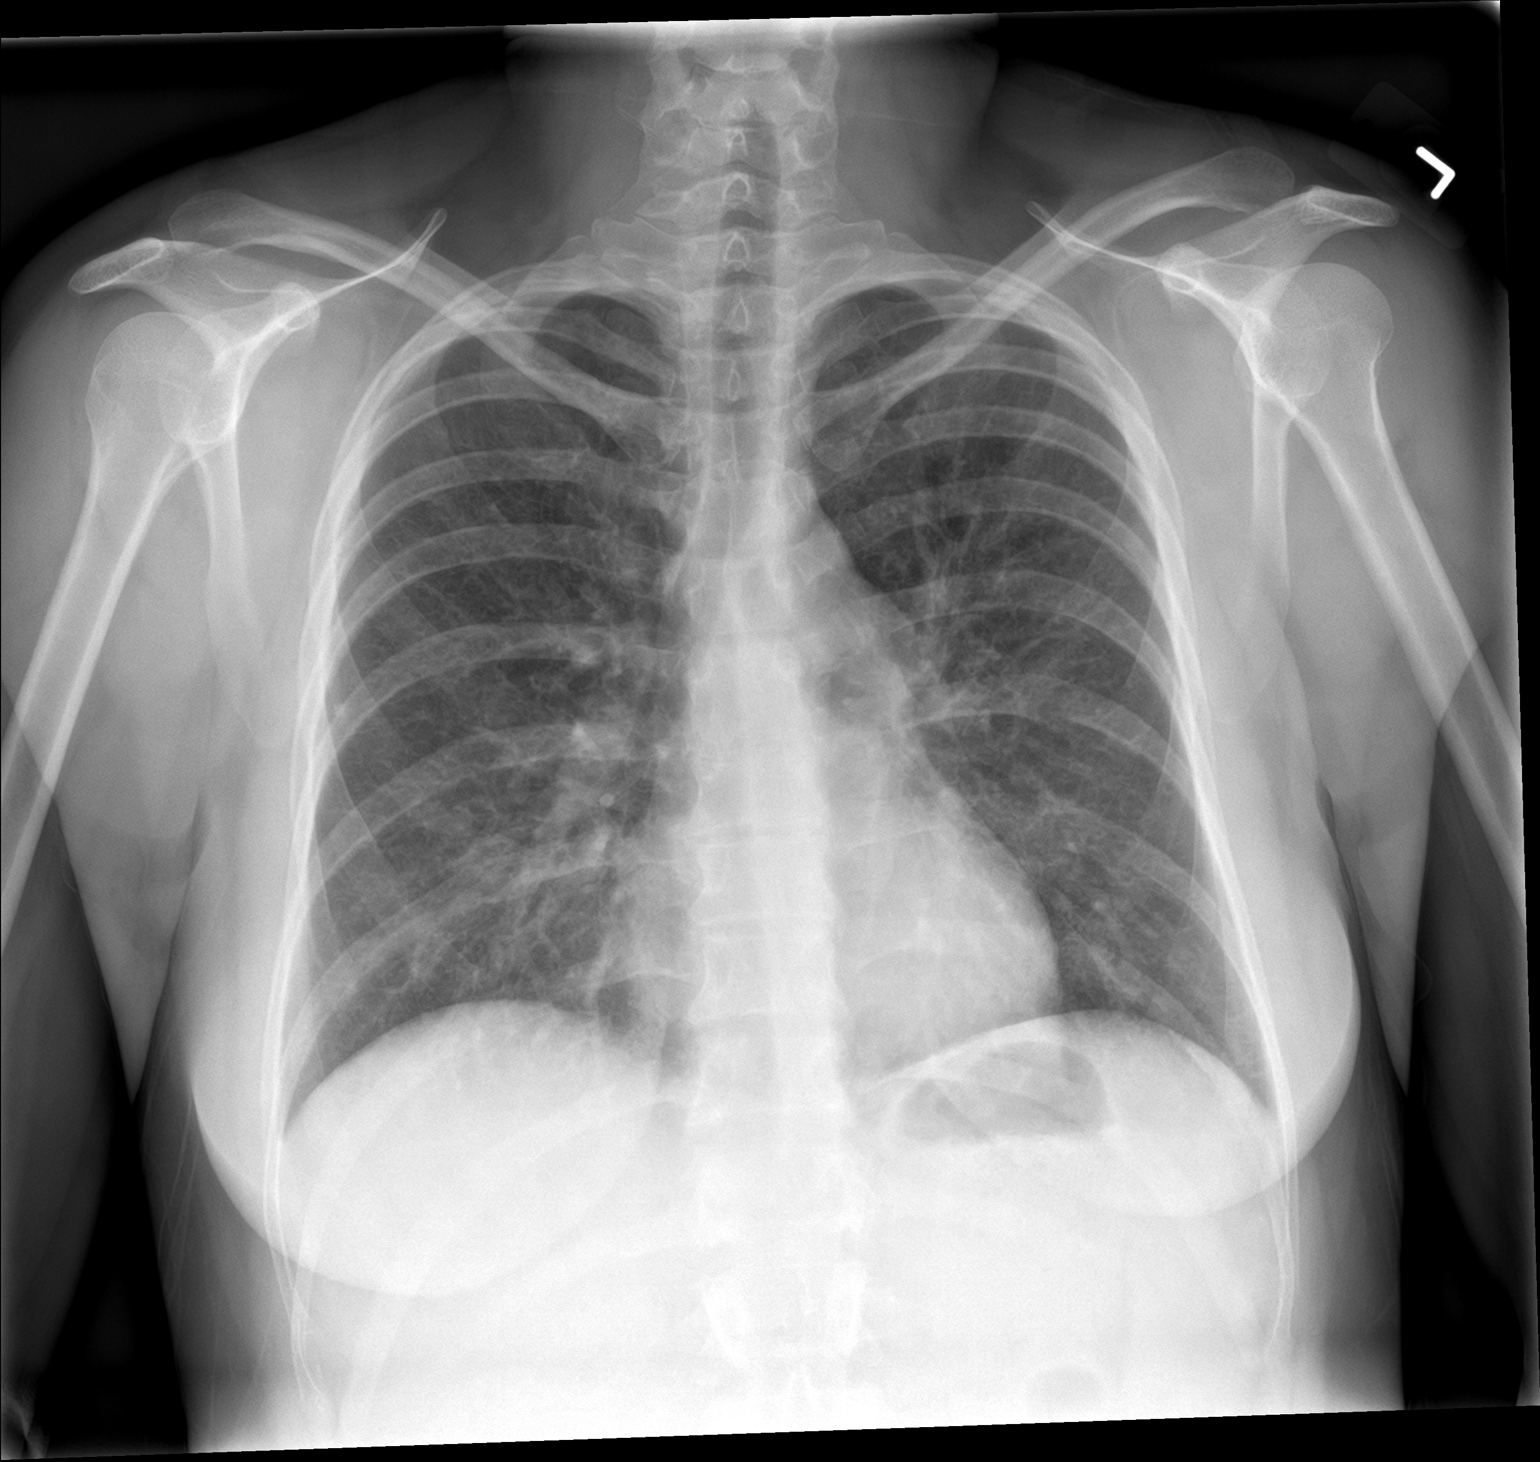

[chest lat]
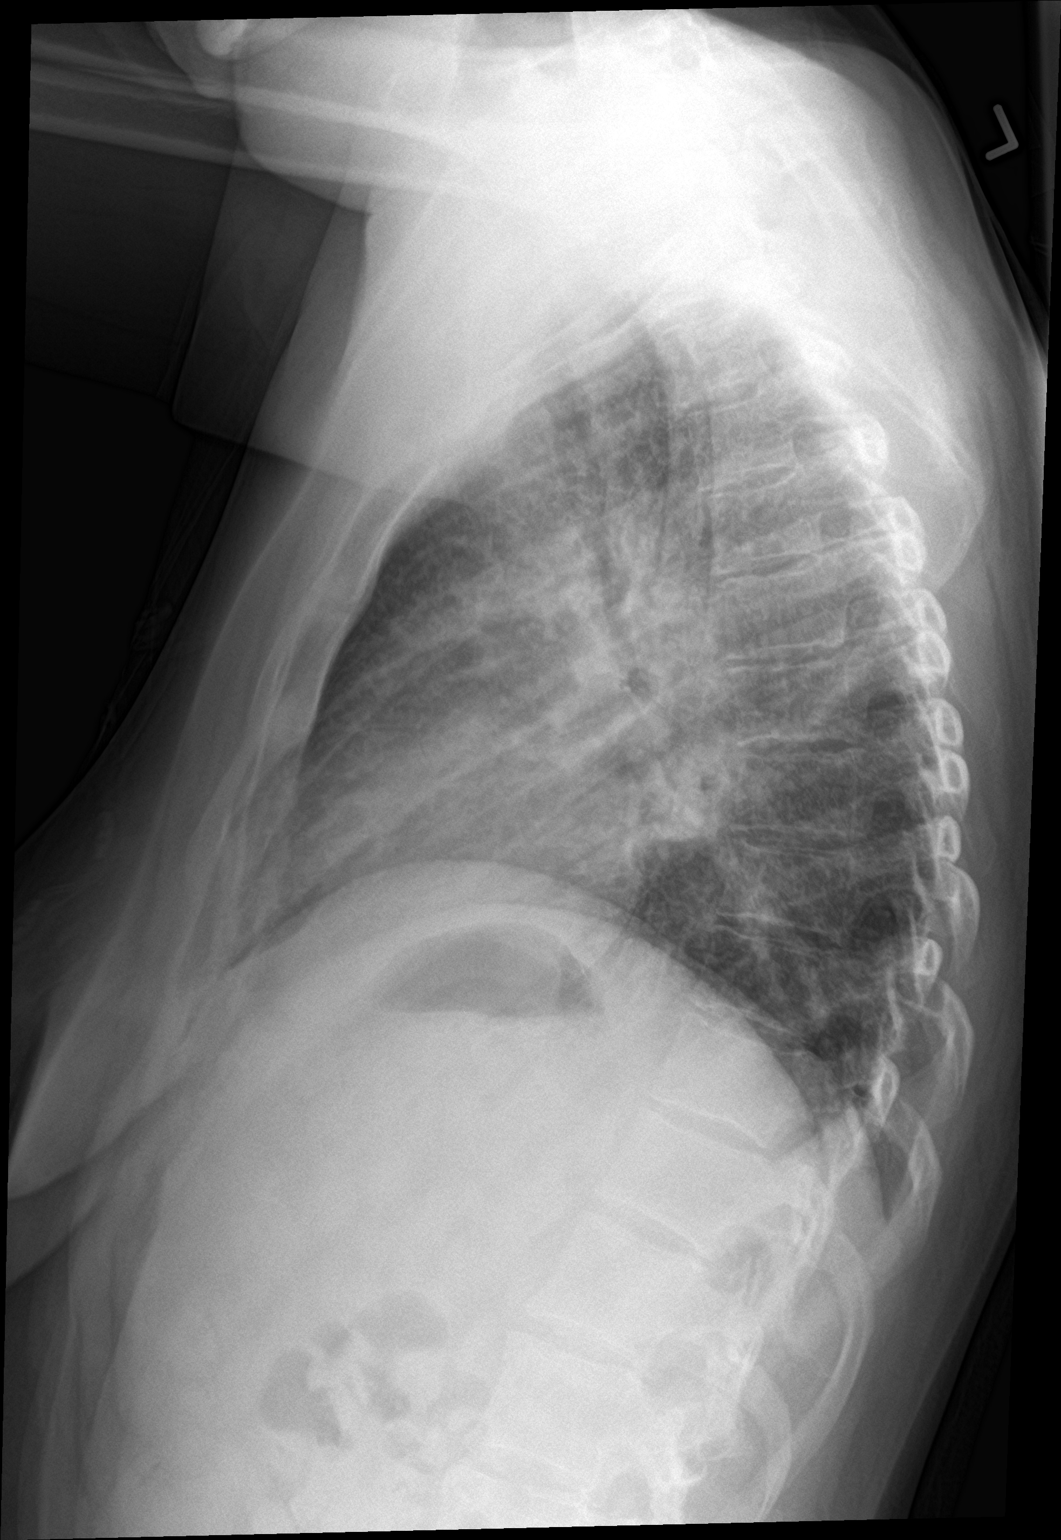

[2 of 2 positions shown; findings below may reference images not displayed]

FINDINGS: The heart size and mediastinal contours are within normal limits.
Both lungs are clear. The visualized skeletal structures are
unremarkable.
IMPRESSION: No active cardiopulmonary disease.
